# Patient Record
Sex: Male | Born: 1995 | Hispanic: No | Marital: Married | Smoking: Current every day smoker
Health system: Southern US, Community
[De-identification: ages and names within clinical notes are randomized; demographics above are authoritative.]

## PROBLEM LIST (undated history)

## (undated) DIAGNOSIS — R569 Unspecified convulsions: Secondary | ICD-10-CM

---
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
## 2015-03-10 ENCOUNTER — Encounter (HOSPITAL_COMMUNITY): Payer: Self-pay

## 2015-03-10 ENCOUNTER — Emergency Department (HOSPITAL_COMMUNITY)
Admission: EM | Admit: 2015-03-10 | Discharge: 2015-03-10 | Disposition: A | Discharge status: Home / Self Care | Payer: Self-pay | Attending: Emergency Medicine | Admitting: Emergency Medicine

## 2015-03-10 DIAGNOSIS — R070 Pain in throat: Secondary | ICD-10-CM | POA: Insufficient documentation

## 2015-03-10 DIAGNOSIS — Z720 Tobacco use: Secondary | ICD-10-CM | POA: Insufficient documentation

## 2015-03-10 DIAGNOSIS — R0989 Other specified symptoms and signs involving the circulatory and respiratory systems: Secondary | ICD-10-CM | POA: Insufficient documentation

## 2015-03-10 MED ORDER — GI COCKTAIL ~~LOC~~
30.0000 mL | Freq: Once | ORAL | Status: AC
  Administered 2015-03-10: 30 mL via ORAL
  Filled 2015-03-10: qty 30

## 2015-03-10 NOTE — Discharge Instructions (Signed)
Globus Syndrome  Globus Syndrome is a feeling of a lump or a sensation of something caught in your throat. Eating food or drinking fluids does not seem to get rid of it. Yet it is not noticeable during the actual act of swallowing food or liquids. Usually there is nothing physically wrong. It is troublesome because it is an unpleasant sensation which is sometimes difficult to ignore and at times may seem to worsen. The syndrome is quite common. It is estimated 45% of the population experiences features of the condition at some stage during their lives. The symptoms are usually temporary. The largest group of people who feel the need to seek medical treatment is females between the ages of 30 to 60.   CAUSES   Globus Syndrome appears to be triggered by or aggravated by stress, anxiety and depression.  · Tension related to stress could product abnormal muscle spasms in the esophagus which would account for the sensation of a lump or ball in your throat.  · Frequent swallowing or drying of the throat caused by anxiety or other strong emotions can also produce this uncomfortable sensation in your throat.  · Fear and sadness can be expressed by the body in many ways. For instance, if you had a relative with throat cancer you might become overly concerned about your own health and develop uncomfortable sensations in your throat.  · The reaction to a crisis or a trauma event in your life can take the form of a lump in your throat. It is as if you are indirectly saying you can not handle or "swallow" one more thing.  DIAGNOSIS   Usually your caregiver will know what is wrong by talking to you and examining you.  If the condition persists for several days, more testing may be done to make sure there is not another problem present. This is usually not the case.  TREATMENT   · Reassurance is often the best treatment available. Usually the problem leaves without treatment over several days.  · Sometimes anti-anxiety medications  may be prescribed.  · Counseling or talk therapy can also help with strong underlying emotions.  · Note that in most cases this is not something that keeps coming back and you should not be concerned or worried.  Document Released: 12/05/2003 Document Revised: 12/07/2011 Document Reviewed: 05/03/2008  Exit[employer]® Patient Information ©2015 [employer]. This information is not intended to replace advice given to you by your health care provider. Make sure you discuss any questions you have with your health care provider.

## 2015-03-10 NOTE — ED Notes (Signed)
Pt states the GI cocktail helped "a little bit", pt states the pain has decreased, pt states it feels like the fishbone has moved a little bit but still feels like it's in his throat. Pt in no distress.

## 2015-03-10 NOTE — ED Provider Notes (Signed)
CSN: [id number]     Arrival date & time 03/10/15  2021 History   First MD Initiated Contact with Patient 03/10/15 2033     Chief Complaint  Patient presents with  . Foreign Body     (Consider location/radiation/quality/duration/timing/severity/associated sxs/prior Treatment) HPI Comments: Patient presents to the emergency department with chief complaint of fish bone stuck in throat. Patient states that he was eating fish tonight, and swallowed a bone. He states that feels like the bone is stuck in his throat. He states that this is aggravating his throat. States that he has pain when he swallows. States that he tried to vomit, but did not have any relief. He denies any difficulty swallowing, eating, or drinking.  The history is provided by the patient. No language interpreter was used.    History reviewed. No pertinent past medical history. History reviewed. No pertinent past surgical history. History reviewed. No pertinent family history. History  Substance Use Topics  . Smoking status: Current Every Day Smoker  . Smokeless tobacco: Not on file  . Alcohol Use: Yes    Review of Systems  Constitutional: Negative for fever and chills.  HENT:       Foreign body sensation in throat  Respiratory: Negative for shortness of breath.   Cardiovascular: Negative for chest pain.  Gastrointestinal: Negative for nausea, vomiting, diarrhea and constipation.  Genitourinary: Negative for dysuria.  All other systems reviewed and are negative.     Allergies  Review of patient's allergies indicates no known allergies.  Home Medications   Prior to Admission medications   Not on File   BP 153/85 mmHg  Pulse 73  Temp(Src) 98.1 F (36.7 C) (Oral)  Resp 20  Ht  (1.676 m)  Wt 130 lb (58.968 kg)  BMI 20.99 kg/m2  SpO2 100% Physical Exam  Constitutional: He is oriented to person, place, and time. He appears well-developed and well-nourished.  HENT:  Head: Normocephalic and  atraumatic.  Oropharynx is clear, no evidence of foreign body, no abscess, uvula is midline, no stridor, airway is intact  Eyes: Conjunctivae and EOM are normal. Pupils are equal, round, and reactive to light. Right eye exhibits no discharge. Left eye exhibits no discharge. No scleral icterus.  Neck: Normal range of motion. Neck supple. No JVD present.  Cardiovascular: Normal rate, regular rhythm and normal heart sounds.  Exam reveals no gallop and no friction rub.   No murmur heard. Pulmonary/Chest: Effort normal and breath sounds normal. No respiratory distress. He has no wheezes. He has no rales. He exhibits no tenderness.  Abdominal: Soft. He exhibits no distension and no mass. There is no tenderness. There is no rebound and no guarding.  Musculoskeletal: Normal range of motion. He exhibits no edema or tenderness.  Neurological: He is alert and oriented to person, place, and time.  Skin: Skin is warm and dry.  Psychiatric: He has a normal mood and affect. His behavior is normal. Judgment and thought content normal.  Nursing note and vitals reviewed.   ED Course  Procedures (including critical care time) Labs Review Labs Reviewed - No data to display  Imaging Review No results found.   EKG Interpretation None      MDM   Final diagnoses:  Foreign body sensation in throat    Patient with foreign body sensation in throat. Some relief with GI cocktail. Will discharge to home with ENT follow-up. Patient understands agrees the plan. Patient discussed with Dr. Jodi Mourning, who agrees with plan.  Roxy Horseman, PA-C 03/10/15 2200  Blane Ohara, MD 03/19/15 2212

## 2015-03-10 NOTE — ED Notes (Signed)
Pt has a fishbone stuck in his throat, he feels like his throat is swelling

## 2019-12-24 ENCOUNTER — Other Ambulatory Visit: Payer: Self-pay

## 2019-12-24 ENCOUNTER — Emergency Department (HOSPITAL_COMMUNITY)
Admission: EM | Admit: 2019-12-24 | Discharge: 2019-12-24 | Disposition: A | Discharge status: Home / Self Care | Payer: Self-pay | Attending: Emergency Medicine | Admitting: Emergency Medicine

## 2019-12-24 ENCOUNTER — Encounter (HOSPITAL_COMMUNITY): Payer: Self-pay

## 2019-12-24 ENCOUNTER — Emergency Department (HOSPITAL_COMMUNITY): Payer: Self-pay

## 2019-12-24 DIAGNOSIS — F17210 Nicotine dependence, cigarettes, uncomplicated: Secondary | ICD-10-CM | POA: Insufficient documentation

## 2019-12-24 DIAGNOSIS — R569 Unspecified convulsions: Secondary | ICD-10-CM | POA: Insufficient documentation

## 2019-12-24 HISTORY — DX: Unspecified convulsions: R56.9

## 2019-12-24 LAB — RAPID URINE DRUG SCREEN, HOSP PERFORMED
Amphetamines: POSITIVE — AB
Barbiturates: NOT DETECTED
Benzodiazepines: NOT DETECTED
Cocaine: NOT DETECTED
Opiates: NOT DETECTED
Tetrahydrocannabinol: POSITIVE — AB

## 2019-12-24 LAB — CBC WITH DIFFERENTIAL/PLATELET
Abs Immature Granulocytes: 0.27 10*3/uL — ABNORMAL HIGH (ref 0.00–0.07)
Basophils Absolute: 0 10*3/uL (ref 0.0–0.1)
Basophils Relative: 1 %
Eosinophils Absolute: 0.1 10*3/uL (ref 0.0–0.5)
Eosinophils Relative: 1 %
HCT: 47 % (ref 39.0–52.0)
Hemoglobin: 15.5 g/dL (ref 13.0–17.0)
Immature Granulocytes: 4 %
Lymphocytes Relative: 29 %
Lymphs Abs: 1.8 10*3/uL (ref 0.7–4.0)
MCH: 30.6 pg (ref 26.0–34.0)
MCHC: 33 g/dL (ref 30.0–36.0)
MCV: 92.7 fL (ref 80.0–100.0)
Monocytes Absolute: 0.7 10*3/uL (ref 0.1–1.0)
Monocytes Relative: 11 %
Neutro Abs: 3.4 10*3/uL (ref 1.7–7.7)
Neutrophils Relative %: 54 %
Platelets: 105 10*3/uL — ABNORMAL LOW (ref 150–400)
RBC: 5.07 MIL/uL (ref 4.22–5.81)
RDW: 12.9 % (ref 11.5–15.5)
WBC: 6.3 10*3/uL (ref 4.0–10.5)
nRBC: 0 % (ref 0.0–0.2)

## 2019-12-24 LAB — URINALYSIS, ROUTINE W REFLEX MICROSCOPIC
Bilirubin Urine: NEGATIVE
Glucose, UA: NEGATIVE mg/dL
Ketones, ur: 20 mg/dL — AB
Leukocytes,Ua: NEGATIVE
Nitrite: NEGATIVE
Protein, ur: 30 mg/dL — AB
Specific Gravity, Urine: 1.019 (ref 1.005–1.030)
pH: 5 (ref 5.0–8.0)

## 2019-12-24 LAB — COMPREHENSIVE METABOLIC PANEL
ALT: 316 U/L — ABNORMAL HIGH (ref 0–44)
AST: 144 U/L — ABNORMAL HIGH (ref 15–41)
Albumin: 4.1 g/dL (ref 3.5–5.0)
Alkaline Phosphatase: 56 U/L (ref 38–126)
Anion gap: 14 (ref 5–15)
BUN: 9 mg/dL (ref 6–20)
CO2: 20 mmol/L — ABNORMAL LOW (ref 22–32)
Calcium: 9.1 mg/dL (ref 8.9–10.3)
Chloride: 107 mmol/L (ref 98–111)
Creatinine, Ser: 1.16 mg/dL (ref 0.61–1.24)
GFR calc Af Amer: >60 mL/min (ref >60)
GFR calc non Af Amer: >60 mL/min (ref >60)
Glucose, Bld: 97 mg/dL (ref 70–99)
Potassium: 3.3 mmol/L — ABNORMAL LOW (ref 3.5–5.1)
Sodium: 141 mmol/L (ref 135–145)
Total Bilirubin: 1.4 mg/dL — ABNORMAL HIGH (ref 0.3–1.2)
Total Protein: 7.2 g/dL (ref 6.5–8.1)

## 2019-12-24 LAB — MAGNESIUM: Magnesium: 2.3 mg/dL (ref 1.7–2.4)

## 2019-12-24 LAB — ETHANOL: Alcohol, Ethyl (B): <10 mg/dL (ref <10)

## 2019-12-24 LAB — CBG MONITORING, ED: Glucose-Capillary: 83 mg/dL (ref 70–99)

## 2019-12-24 MED ORDER — LORAZEPAM 2 MG/ML IJ SOLN
INTRAMUSCULAR | Status: AC
  Administered 2019-12-24: 1 mg
  Filled 2019-12-24: qty 1

## 2019-12-24 NOTE — ED Notes (Signed)
Pt's mother updated with pt's permission.  °

## 2019-12-24 NOTE — ED Notes (Signed)
Pt states that he took insulin last night; when asked how much, pt stated "to the 40 on the little syringe"; no hx DM, states he "found it and wanted to try it"; [place], PA, notfied

## 2019-12-24 NOTE — ED Notes (Signed)
Wasted 1 mg atvian in [facility] room; Witnessed by [place], Charity fundraiser

## 2019-12-24 NOTE — Discharge Instructions (Addendum)
As we discussed, your work-up today was reassuring.  We think that the seizure was most likely caused by a combo of drugs and insulin.  Do not take any more insulin.  As we discussed, you will need to follow-up with neurology.  I provided you a referral.  Please call their office.  You should not drive, bathe, shower, swim alone until you are seen by neurology.  Return the emergency department for any worsening seizure, vomiting, any other worsening or concerning symptoms.

## 2019-12-24 NOTE — ED Notes (Signed)
Pt ambulated in room without assistance. No reports of dizziness or lightheadedness.
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
## 2019-12-24 NOTE — ED Notes (Signed)
Pt transported to CT ?
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
## 2019-12-24 NOTE — ED Notes (Signed)
Patient verbalizes understanding of discharge instructions. Opportunity for questioning and answers were provided. Armband removed by staff, pt discharged from ED. Pt. ambulatory and discharged home.  

## 2019-12-24 NOTE — ED Notes (Signed)
Pt transported to xray 

## 2020-02-09 ENCOUNTER — Ambulatory Visit: Payer: Self-pay | Admitting: Neurology

## 2020-02-09 ENCOUNTER — Telehealth: Payer: Self-pay

## 2020-02-09 NOTE — Telephone Encounter (Signed)
Pt no showed 02/09/2020 new pt appt with Dr. Terrace Arabia.

## 2020-07-28 IMAGING — CT CT HEAD W/O CM
3 series · 16 of 47 positions shown, 19 images · non-contrast
Comparison: None.

CLINICAL DATA: Patient with seizure.

EXAM:
CT HEAD WITHOUT CONTRAST
TECHNIQUE: Contiguous axial images were obtained from the base of the skull
through the vertex without intravenous contrast.

[Series 3: head 5.0 h30s · axial · 0.42mm/px · z∈[-124,+6]mm · 10 of 32 slices shown, 13 images]
[im 3/32  brain]
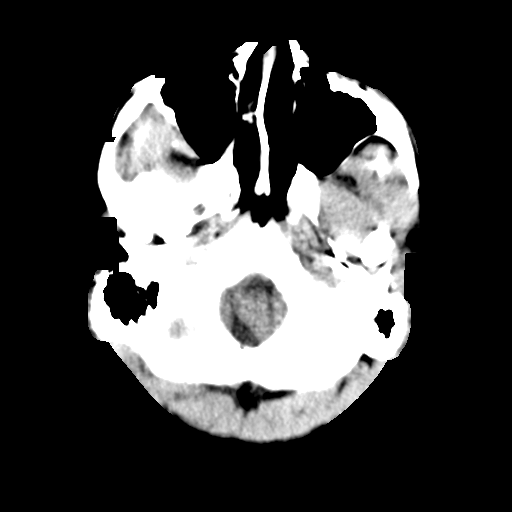
[im 3/32  bone]
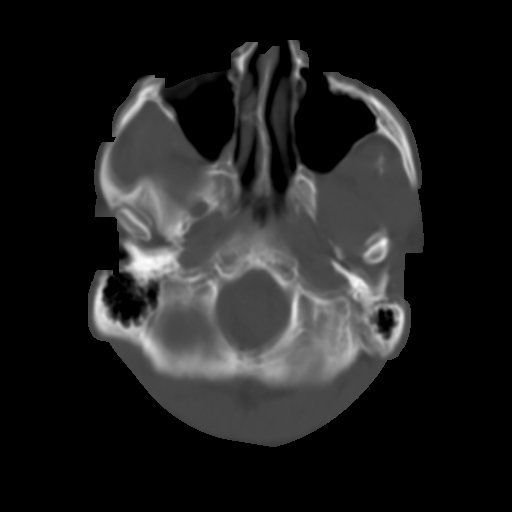
[im 6/32  brain]
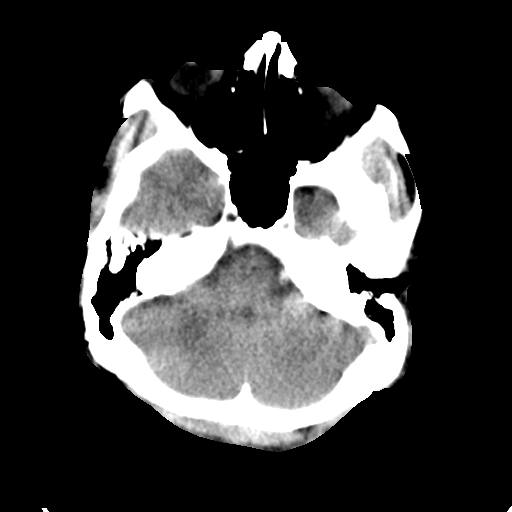
[im 9/32  brain]
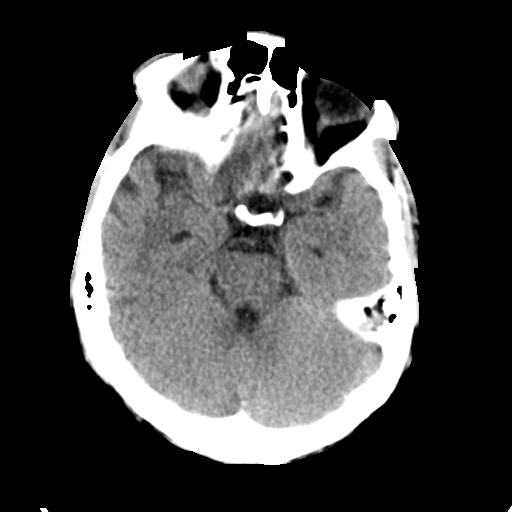
[im 11/32  brain]
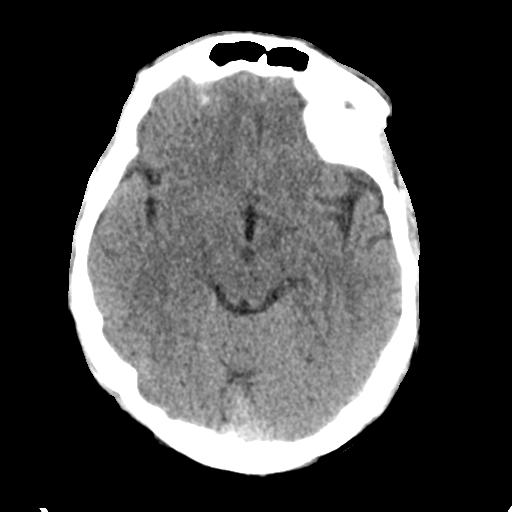
[im 14/32  brain]
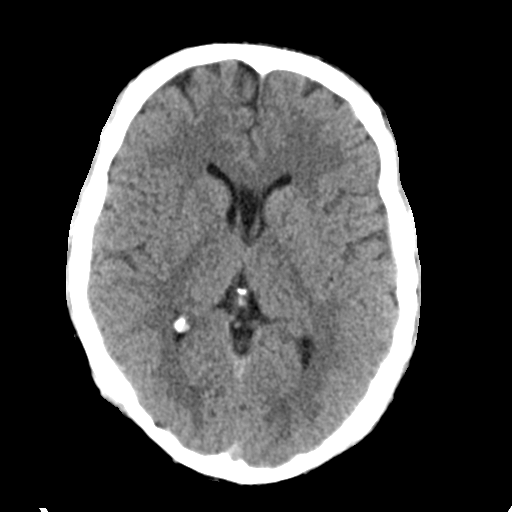
[im 14/32  bone]
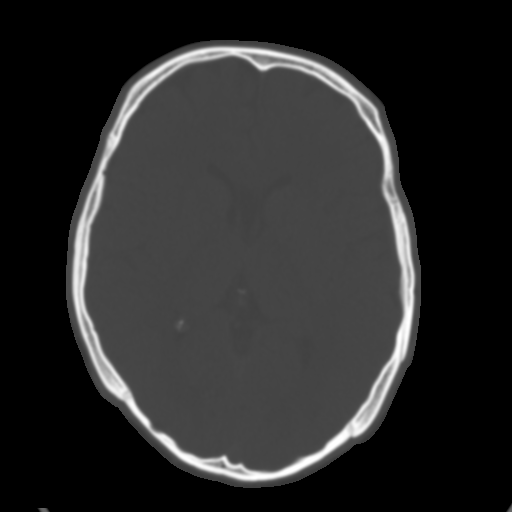
[im 18/32  brain]
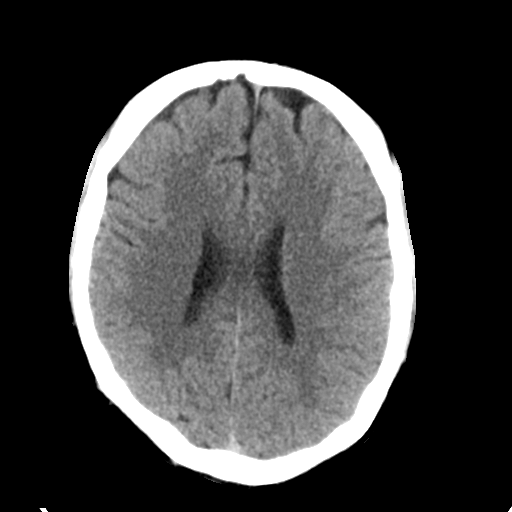
[im 21/32  brain]
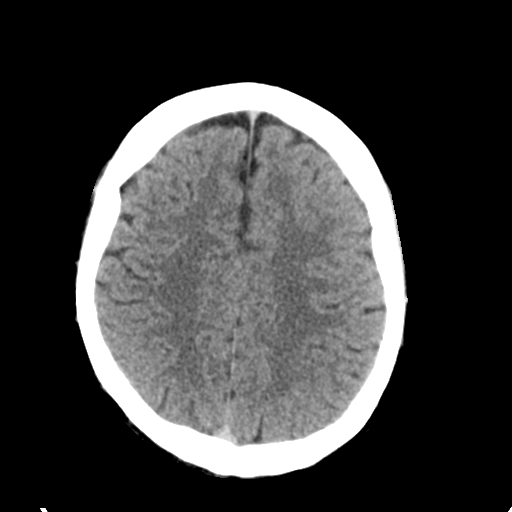
[im 24/32  brain]
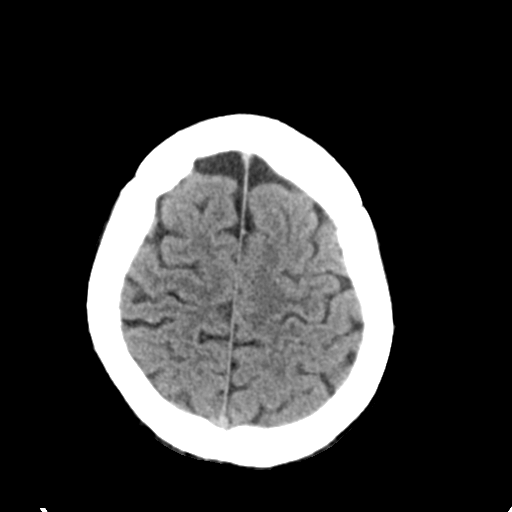
[im 26/32  brain]
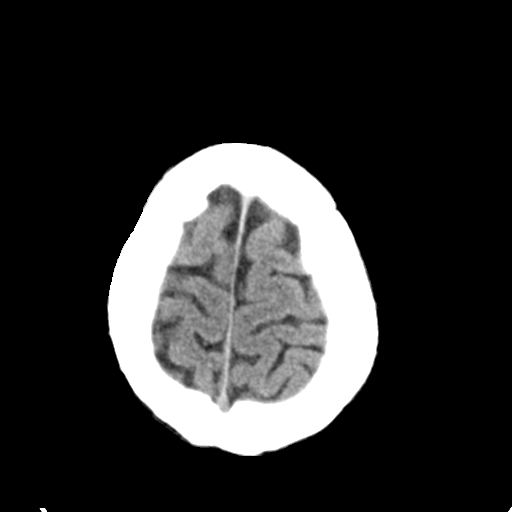
[im 26/32  bone]
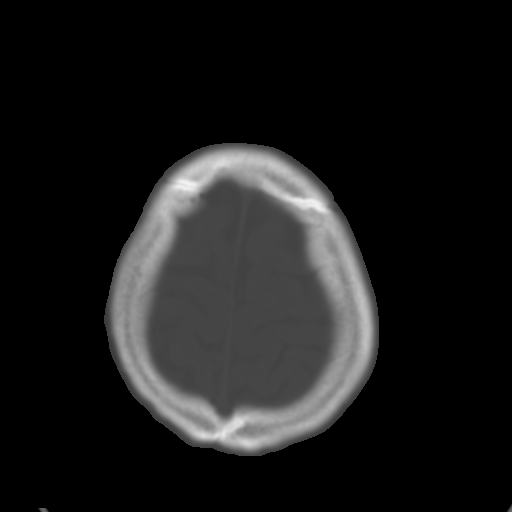
[im 29/32  brain]
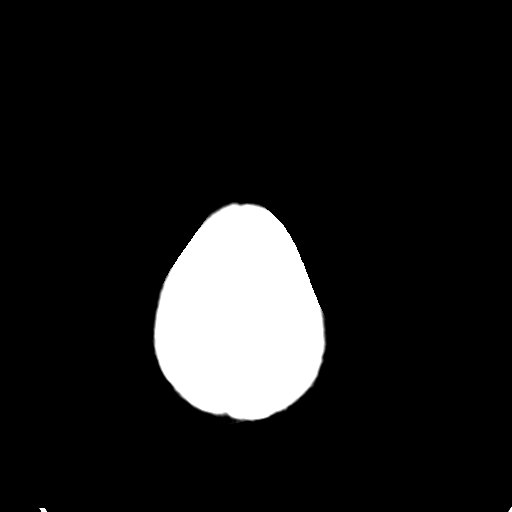

[Series 5: head 3.0 mpr cor · coronal · 0.32mm/px · 3 of 67 slices shown]
[im 23/67  brain]
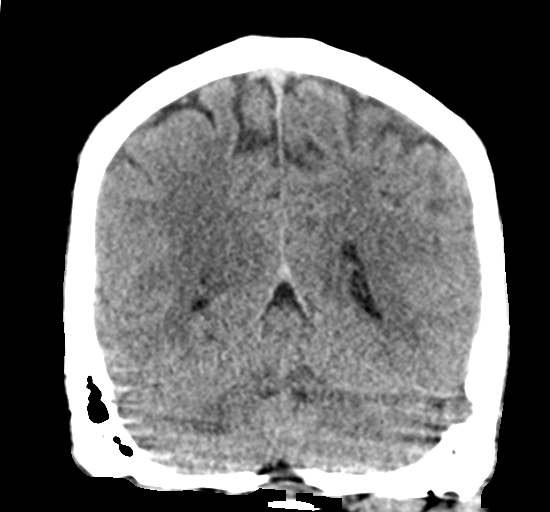
[im 30/67  brain]
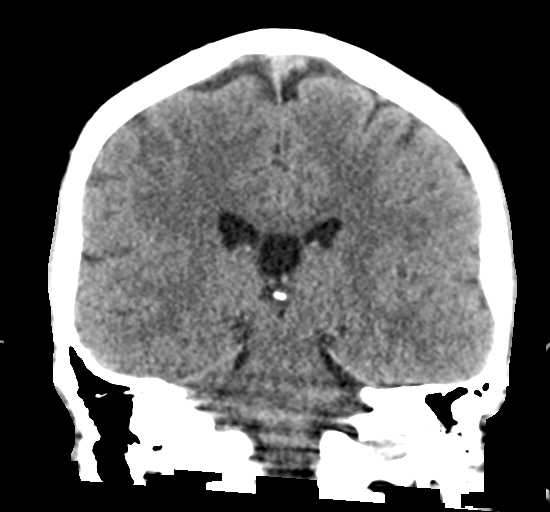
[im 37/67  brain]
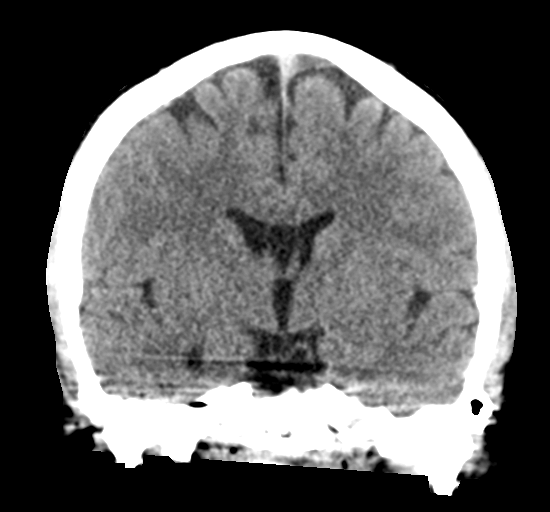

[Series 6: head 3.0 mpr sag · sagittal · 0.32mm/px · 3 of 57 slices shown]
[im 19/57  brain]
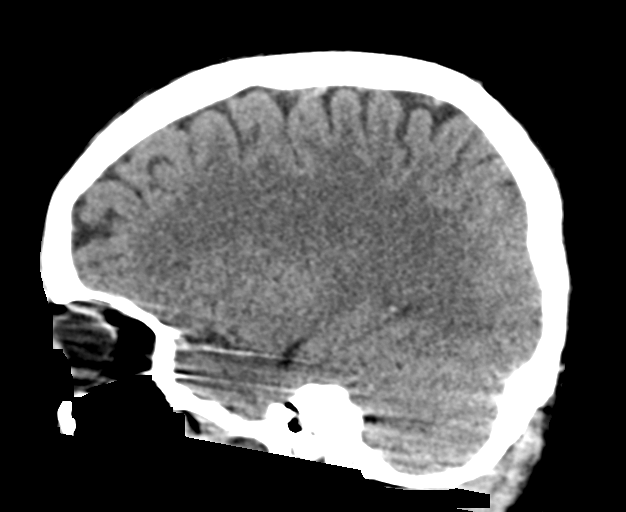
[im 29/57  brain]
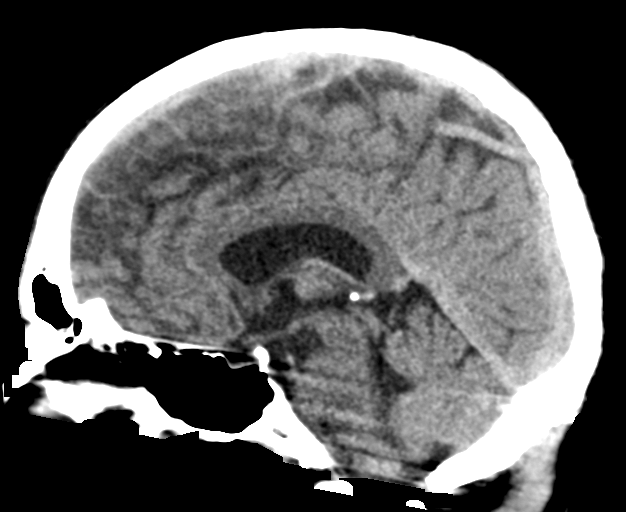
[im 38/57  brain]
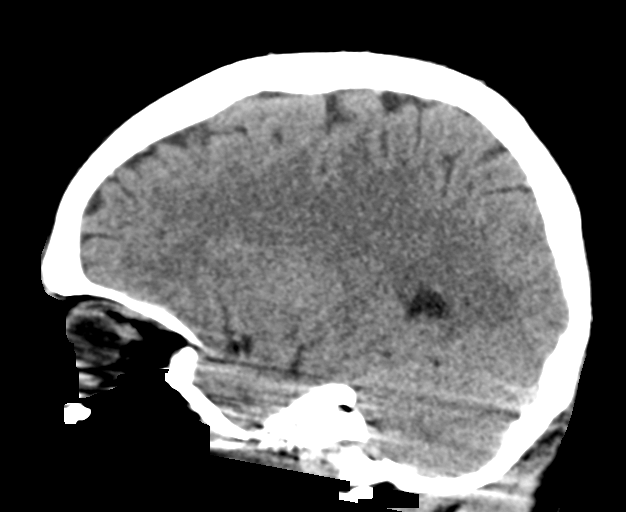

[16 of 47 positions shown; findings below may reference images not displayed]

FINDINGS: Brain: No evidence of acute infarction, hemorrhage, hydrocephalus,
extra-axial collection or mass lesion/mass effect.

Vascular: No hyperdense vessel or unexpected calcification.

Skull: Normal. Negative for fracture or focal lesion.

Sinuses/Orbits: No acute finding.

Other: None.
IMPRESSION: No acute intracranial process.

## 2020-08-26 ENCOUNTER — Ambulatory Visit (HOSPITAL_COMMUNITY): Payer: Self-pay | Admitting: Behavioral Health

## 2020-11-14 ENCOUNTER — Encounter (HOSPITAL_COMMUNITY): Payer: Self-pay | Admitting: Emergency Medicine

## 2020-11-14 ENCOUNTER — Emergency Department (HOSPITAL_COMMUNITY)
Admission: EM | Admit: 2020-11-14 | Discharge: 2020-11-15 | Disposition: A | Discharge status: Home / Self Care | Payer: Self-pay | Attending: Emergency Medicine | Admitting: Emergency Medicine

## 2020-11-14 DIAGNOSIS — S60511A Abrasion of right hand, initial encounter: Secondary | ICD-10-CM | POA: Insufficient documentation

## 2020-11-14 DIAGNOSIS — W01198A Fall on same level from slipping, tripping and stumbling with subsequent striking against other object, initial encounter: Secondary | ICD-10-CM | POA: Insufficient documentation

## 2020-11-14 DIAGNOSIS — F17200 Nicotine dependence, unspecified, uncomplicated: Secondary | ICD-10-CM | POA: Insufficient documentation

## 2020-11-14 DIAGNOSIS — F1290 Cannabis use, unspecified, uncomplicated: Secondary | ICD-10-CM | POA: Insufficient documentation

## 2020-11-14 DIAGNOSIS — Z23 Encounter for immunization: Secondary | ICD-10-CM | POA: Insufficient documentation

## 2020-11-14 DIAGNOSIS — M542 Cervicalgia: Secondary | ICD-10-CM | POA: Insufficient documentation

## 2020-11-14 DIAGNOSIS — Y92410 Unspecified street and highway as the place of occurrence of the external cause: Secondary | ICD-10-CM | POA: Insufficient documentation

## 2020-11-14 DIAGNOSIS — S01511A Laceration without foreign body of lip, initial encounter: Secondary | ICD-10-CM | POA: Insufficient documentation

## 2020-11-14 MED ORDER — KETOROLAC TROMETHAMINE 60 MG/2ML IM SOLN
60.0000 mg | Freq: Once | INTRAMUSCULAR | Status: AC
  Administered 2020-11-14: 60 mg via INTRAMUSCULAR
  Filled 2020-11-14: qty 2

## 2020-11-14 MED ORDER — TETANUS-DIPHTH-ACELL PERTUSSIS 5-2.5-18.5 LF-MCG/0.5 IM SUSY
0.5000 mL | PREFILLED_SYRINGE | Freq: Once | INTRAMUSCULAR | Status: AC
  Administered 2020-11-14: 0.5 mL via INTRAMUSCULAR
  Filled 2020-11-14: qty 0.5

## 2020-11-14 MED ORDER — LIDOCAINE-EPINEPHRINE (PF) 2 %-1:200000 IJ SOLN
10.0000 mL | Freq: Once | INTRAMUSCULAR | Status: AC
  Administered 2020-11-14: 10 mL
  Filled 2020-11-14: qty 20
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
## 2020-11-15 MED ORDER — AMOXICILLIN-POT CLAVULANATE 875-125 MG PO TABS
1.0000 | ORAL_TABLET | Freq: Once | ORAL | Status: AC
  Administered 2020-11-15: 1 via ORAL
  Filled 2020-11-15: qty 1

## 2020-11-15 MED ORDER — AMOXICILLIN-POT CLAVULANATE 875-125 MG PO TABS: 1.0000 | ORAL_TABLET | Freq: Two times a day (BID) | ORAL | 0 refills | Status: DC

## 2020-11-15 NOTE — ED Notes (Signed)
Pt refused discharge vital signs

## 2020-11-15 NOTE — Discharge Instructions (Addendum)
Please read the attachment on mouth lacerations.  You have been repaired with sutures that will dissolve spontaneously in the next 5 to 10 days.  Do not remove them prematurely.  Do not pick at them.  You have been given first dose of Augmentin here in the ED.  Please pick up your prescription for a 7-day course.  This will help mitigate risk of infection of your mouth and/or hand.  Please take ibuprofen 600 mg every 6 hours as needed for pain and inflammation.  DO NOT SMOKE anything for the next 14 days.    Please follow-up with your primary care provider regarding today's encounter and for ongoing evaluation.  You will need to have a wound check in the next 3 days.  Return to the ED or seek immediate medical attention should you express any new or worsening symptoms.

## 2020-11-18 ENCOUNTER — Other Ambulatory Visit: Payer: Self-pay

## 2020-11-18 ENCOUNTER — Ambulatory Visit (INDEPENDENT_AMBULATORY_CARE_PROVIDER_SITE_OTHER): Payer: No Payment, Other | Admitting: Behavioral Health

## 2020-11-18 DIAGNOSIS — F1910 Other psychoactive substance abuse, uncomplicated: Secondary | ICD-10-CM

## 2020-11-18 NOTE — Progress Notes (Signed)
Comprehensive Clinical Assessment (CCA) Note  11/18/2020 Leola BrazilMiguel Devins [id number]  Leola BrazilMiguel Barga is a 25 year old male who presents to [facility] for a scheduled Comprehensive Clinical Assessment. Clt reports he is being referred by his probation officer and the [facility] for mental health and substance use treatment. Client has has an upcoming court date on 11/28/2020 for the following current pending legal charges:    Felony-POSSESS STOLEN MOTOR VEHICLE    FELONY POSSESSION METHAMPHETAMINE    Felony-FELONY PROBATION VIOLATION   Felony-FELONY PROBATION VIOLATION  Felony-SECOND DEGREE BURGLARY  Felony-LARCENY AFTER BREAK/ENTER  Felony-FELONY PROBATION VIOLATION  Felony-FELONY PROBATION VIOLATION  Clt reports he last used marijuana "5-6 days ago" and heroin and meth "6-8 months ago". Clt did not appear to be forthcoming with reporting hx of substance use. Clt further reports sxs of depression such as hopelessness, worthlessness, agitation, and poor sleep patterns. Clt denied past psychiatric hospitalization. He reports he [facility], but reports this was unsuccessful. He denied SI/HI. Pt reported hx of auditory hallucinations; however, he was unable to describe these audible sounds. Clt did not appear to be responding to internal stimuli while speaking with this Clinical research associatewriter. Collateral information was obtained from clt's probation officer Adella Nissen(Leslie Huber: [phone] / [phone]) - clt signed ROI.  Recommendation: Substance Abuse Intensive Outpatient   Chief Complaint:  Chief Complaint  Patient presents with  . Addiction Problem   Visit Diagnosis:  Polysubstance Use Disorder Antisocial Personality Disorder    CCA Screening, Triage and Referral (STR)  Patient Reported Information How did you hear about us? Legal System  Referral name: Adella NissenLeslie Huber (probation officer)  Referral phone number:  [phone]   Whom do you see for routine medical problems? I don't have a doctor  Practice/Facility Name: No data recorded Practice/Facility Phone Number: No data recorded Name of Contact: No data recorded Contact Number: No data recorded Contact Fax Number: No data recorded Prescriber Name: No data recorded Prescriber Address (if known): No data recorded  What Is the Reason for Your Visit/Call Today? Mental Health Court  How Long Has This Been Causing You Problems? > than 6 months  What Do You Feel Would Help You the Most Today? Group Therapy   Have You Recently Been in Any Inpatient Treatment (Hospital/Detox/Crisis Center/28-Day Program)? No  Name/Location of Program/Hospital:No data recorded How Long Were You There? No data recorded When Were You Discharged? No data recorded  Have You Ever Received Services [facility] Before? No  Who [facility] You [facility]? No data recorded  Have You Recently Had Any Thoughts About Hurting Yourself? No  Are You Planning to Commit Suicide/Harm Yourself At This time? No   Have you Recently Had Thoughts About Hurting Someone Karolee Ohslse? No  Explanation: No data recorded  Have You Used Any Alcohol or Drugs in the Past 24 Hours? No  How Long Ago Did You Use Drugs or Alcohol? No data recorded What Did You Use and How Much? No data recorded  Do You Currently Have a Therapist/Psychiatrist? No  Name of Therapist/Psychiatrist: No data recorded  Have You Been Recently Discharged From Any Office Practice or Programs? No  Explanation of Discharge From Practice/Program: No data recorded    CCA Screening Triage Referral Assessment Type of Contact: No data recorded Is this Initial or Reassessment? No data recorded Date Telepsych consult ordered in [facility]:  No data recorded Time Telepsych consult ordered in [facility]:  No data recorded  Patient Reported Information Reviewed? No data recorded Patient Left Without Being Seen? No data recorded Reason  for Not Completing Assessment: No data recorded  Collateral Involvement: No data recorded  Does Patient Have a Court Appointed Legal Guardian? No data recorded Name and Contact of Legal Guardian: No data recorded If Minor and Not Living with Parent(s), Who has Custody? No data recorded Is CPS involved or ever been involved? No data recorded Is APS involved or ever been involved? No data recorded  Patient Determined To Be At Risk for Harm To Self or Others Based on Review of Patient Reported Information or Presenting Complaint? No data recorded Method: No data recorded Availability of Means: No data recorded Intent: No data recorded Notification Required: No data recorded Additional Information for Danger to Others Potential: No data recorded Additional Comments for Danger to Others Potential: No data recorded Are There Guns or Other Weapons in Your Home? No data recorded Types of Guns/Weapons: No data recorded Are These Weapons Safely Secured?                            No data recorded Who Could Verify You Are Able To Have These Secured: No data recorded Do You Have any Outstanding Charges, Pending Court Dates, Parole/Probation? No data recorded Contacted To Inform of Risk of Harm To Self or Others: No data recorded  Location of Assessment: No data recorded  Does Patient Present under Involuntary Commitment? No data recorded IVC Papers Initial File Date: No data recorded  [place] of Residence: No data recorded  Patient Currently Receiving the Following Services: No data recorded  Determination of Need: No data recorded  Options For Referral: No data recorded    CCA Biopsychosocial Intake/Chief Complaint:  Active addiction; depression  Current Symptoms/Problems: Depression   Patient Reported Schizophrenia/Schizoaffective Diagnosis in Past: No   Strengths: No data recorded Preferences: No data recorded Abilities: No data recorded  Type of Services Patient Feels are  Needed: No data recorded  Initial Clinical Notes/Concerns: No data recorded  Mental Health Symptoms Depression:  Difficulty Concentrating; Hopelessness; Worthlessness; Tearfulness; Irritability; Change in energy/activity; Increase/decrease in appetite; Sleep (too much or little)   Duration of Depressive symptoms: Greater than two weeks ("since I was 25yo")   Mania:  None   Anxiety:   None   Psychosis:  Hallucinations   Duration of Psychotic symptoms: Greater than six months   Trauma:  Hypervigilance; Avoids reminders of event; Irritability/anger; Guilt/shame; Difficulty staying/falling asleep; Re-experience of traumatic event; Emotional numbing   Obsessions:  N/A   Compulsions:  N/A   Inattention:  N/A   Hyperactivity/Impulsivity:  N/A   Oppositional/Defiant Behaviors:  N/A   Emotional Irregularity:  N/A   Other Mood/Personality Symptoms:  No data recorded   Mental Status Exam Appearance and self-care  Stature:  Small   Weight:  Average weight   Clothing:  Age-appropriate   Grooming:  Normal   Cosmetic use:  None   Posture/gait:  Normal   Motor activity:  Not Remarkable   Sensorium  Attention:  Normal   Concentration:  Normal   Orientation:  X5   Recall/memory:  Normal   Affect and Mood  Affect:  Appropriate   Mood:  Other (Comment) (Pleasant)   Relating  Eye contact:  Fleeting   Facial expression:  Responsive   Attitude toward examiner:  Cooperative   Thought and Language  Speech flow: Clear and Coherent   Thought content:  Appropriate to Mood and Circumstances   Preoccupation:  None   Hallucinations:  None   Organization:  No data recorded  [employer] of Knowledge:  Fair   Intelligence:  Average   Abstraction:  Normal   Judgement:  Poor   Reality Testing:  Adequate   Insight:  Poor   Decision Making:  Impulsive   Social Functioning  Social Maturity:  Impulsive; Irresponsible   Social Judgement:   Heedless; Victimized; "Garment/textile technologist   Stress  Stressors:  Family conflict   Coping Ability:  Deficient supports   Skill Deficits:  Merchant navy officer   Supports:  Family     Religion: Religion/Spirituality Are You A Religious Person?: Yes What is Your Religious Affiliation?: Catholic How Might This Affect Treatment?: None  Leisure/Recreation: Leisure / Recreation Do You Have Hobbies?: Yes Leisure and Hobbies: "Soccer, fishing"  Exercise/Diet: Exercise/Diet Do You Exercise?: No Have You Gained or Lost A Significant Amount of Weight in the Past Six Months?: No Do You Follow a Special Diet?: No Do You Have Any Trouble Sleeping?: Yes Explanation of Sleeping Difficulties: Difficulties getting to sleep and staying asleep   CCA Employment/Education Employment/Work Situation: Employment / Work Situation Employment situation: Unemployed What is the longest time patient has a held a job?: 2-3 weeks Where was the patient employed at that time?: [employer] Has patient ever been in the [employer]?: No  Education: Education Is Patient Currently Attending School?: No Last Grade Completed: 11 Name of High School: [employer] Did Garment/textile technologist From [employer]?: No Did You Product manager?: No Did Designer, television/film set?: No Did You Have An Individualized Education Program (IIEP): Yes (Speech classes) Did You Have Any Difficulty At School?: No Patient's Education Has Been Impacted by Current Illness: No   CCA Family/Childhood History Family and Relationship History: Family history Marital status: Single Are you sexually active?: Yes What is your sexual orientation?: Heterosexual Does patient have children?: No  Childhood History:  Childhood History By whom was/is the patient raised?: Mother Description of patient's relationship with caregiver when they were a child: "Pretty good" Patient's description of current relationship with people  who raised him/her: "Pretty good" How were you disciplined when you got in trouble as a child/adolescent?: UTA Does patient have siblings?: Yes Number of Siblings: 2 Description of patient's current relationship with siblings: 2 Sisters (19yo & 26yo) - "not so good .Marland Kitchen yesterday my sister was trying to call the police on me" Did patient suffer any verbal/emotional/physical/sexual abuse as a child?: No Did patient suffer from severe childhood neglect?: No Has patient ever been sexually abused/assaulted/raped as an adolescent or adult?: No Was the patient ever a victim of a crime or a disaster?: No Witnessed domestic violence?: No Has patient been affected by domestic violence as an adult?: No  Child/Adolescent Assessment: N/A     CCA Substance Use Alcohol/Drug Use: Alcohol / Drug Use Pain Medications: See MAR Prescriptions: See MAR Over the Counter: See MAR History of alcohol / drug use?: Yes Longest period of sobriety (when/how long): Pt unable to recall Negative Consequences of Use: Legal,Personal relationships,Work / School,Financial Withdrawal Symptoms: Aggressive/Assaultive,Irritability Substance #1 Name of Substance 1: Heroin 1 - Age of First Use: 17 1 - Amount (size/oz): $10.00 1 - Frequency: Daily 1 - Duration: UTA 1 - Last Use / Amount: 6-8 months ago 1 - Method of Aquiring: Working 1- Route of Use: IV Substance #2 Name of Substance 2: Meth 2 - Age of First  Use: 17 2 - Amount (size/oz): "a gram a day" 2 - Frequency: Daily 2 - Duration: Years 2 - Last Use / Amount: 6-8 months ago 2 - Method of Aquiring: "Working" 2 - Route of Substance Use: Snort then to IV Substance #3 Name of Substance 3: Marijuana 3 - Age of First Use: 15 3 - Amount (size/oz): $5.00 3 - Frequency: Daily 3 - Duration: Years 3 - Last Use / Amount: 5-6 days ago 3 - Method of Aquiring: Money from mother and sister 3 - Route of Substance Use: Smoking      ASAM's:  Six Dimensions of  Multidimensional Assessment  Dimension 1:  Acute Intoxication and/or Withdrawal Potential:   Dimension 1:  Description of individual's past and current experiences of substance use and withdrawal: Continued substance use despite negative consequences  Dimension 2:  Biomedical Conditions and Complications:   Dimension 2:  Description of patient's biomedical conditions and  complications: Clt reports hx of abdominal pain that he has reported in the past  Dimension 3:  Emotional, Behavioral, or Cognitive Conditions and Complications:  Dimension 3:  Description of emotional, behavioral, or cognitive conditions and complications: Reported depressive sxs  Dimension 4:  Readiness to Change:  Dimension 4:  Description of Readiness to Change criteria: Pre-contemplation  Dimension 5:  Relapse, Continued use, or Continued Problem Potential:  Dimension 5:  Relapse, continued use, or continued problem potential critiera description: High risk for relapse  Dimension 6:  Recovery/Living Environment:  Dimension 6:  Recovery/Iiving environment criteria description: Stressful living environment  ASAM Severity Score: ASAM's Severity Rating Score: 18  ASAM Recommended Level of Treatment: ASAM Recommended Level of Treatment: Level II Intensive Outpatient Treatment   Substance use Disorder (SUD) Substance Use Disorder (SUD)  Checklist Symptoms of Substance Use: Continued use despite having a persistent/recurrent physical/psychological problem caused/exacerbated by use,Continued use despite persistent or recurrent social, interpersonal problems, caused or exacerbated by use,Evidence of tolerance,Large amounts of time spent to obtain, use or recover from the substance(s),Persistent desire or unsuccessful efforts to cut down or control use,Presence of craving or strong urge to use,Recurrent use that results in a failure to fulfill major role obligations (work, school, home),Repeated use in physically hazardous  situations,Social, occupational, recreational activities given up or reduced due to use,Substance(s) often taken in larger amounts or over longer times than was intended  Recommendations for Services/Supports/Treatments: Recommendations for Services/Supports/Treatments Recommendations For Services/Supports/Treatments: SAIOP (Substance Abuse Intensive Outpatient Program)  DSM5 Diagnoses: There are no problems to display for this patient.   Patient Centered Plan: Patient is on the following Treatment Plan(s):  Depression, Impulse Control, Post Traumatic Stress Disorder and Substance Abuse  Mamie Nick, Counselor

## 2020-11-22 ENCOUNTER — Ambulatory Visit (HOSPITAL_COMMUNITY): Payer: No Payment, Other | Admitting: Behavioral Health

## 2020-11-25 ENCOUNTER — Ambulatory Visit (HOSPITAL_COMMUNITY): Payer: No Payment, Other | Admitting: Behavioral Health

## 2020-11-29 ENCOUNTER — Ambulatory Visit (HOSPITAL_COMMUNITY): Payer: No Payment, Other | Admitting: Behavioral Health

## 2020-12-02 ENCOUNTER — Ambulatory Visit (HOSPITAL_COMMUNITY): Payer: No Payment, Other | Admitting: Behavioral Health

## 2020-12-04 ENCOUNTER — Telehealth (HOSPITAL_COMMUNITY): Payer: Self-pay | Admitting: Behavioral Health

## 2020-12-06 ENCOUNTER — Ambulatory Visit (HOSPITAL_COMMUNITY): Payer: No Payment, Other

## 2020-12-09 ENCOUNTER — Ambulatory Visit (HOSPITAL_COMMUNITY): Payer: No Payment, Other

## 2020-12-13 ENCOUNTER — Ambulatory Visit (HOSPITAL_COMMUNITY): Payer: No Payment, Other

## 2020-12-16 ENCOUNTER — Ambulatory Visit (HOSPITAL_COMMUNITY): Payer: No Payment, Other

## 2020-12-20 ENCOUNTER — Ambulatory Visit (HOSPITAL_COMMUNITY): Payer: No Payment, Other

## 2020-12-23 ENCOUNTER — Ambulatory Visit (HOSPITAL_COMMUNITY): Payer: No Payment, Other

## 2020-12-27 ENCOUNTER — Ambulatory Visit (HOSPITAL_COMMUNITY): Payer: No Payment, Other

## 2022-07-30 ENCOUNTER — Encounter (HOSPITAL_COMMUNITY): Payer: Self-pay | Admitting: Emergency Medicine

## 2022-07-30 ENCOUNTER — Ambulatory Visit (HOSPITAL_COMMUNITY)
Admission: EM | Admit: 2022-07-30 | Discharge: 2022-07-31 | Disposition: A | Discharge status: Home / Self Care | Payer: No Payment, Other | Attending: Family | Admitting: Family

## 2022-07-30 DIAGNOSIS — F23 Brief psychotic disorder: Secondary | ICD-10-CM

## 2022-07-30 DIAGNOSIS — F1520 Other stimulant dependence, uncomplicated: Secondary | ICD-10-CM | POA: Diagnosis not present

## 2022-07-30 DIAGNOSIS — F29 Unspecified psychosis not due to a substance or known physiological condition: Secondary | ICD-10-CM

## 2022-07-30 DIAGNOSIS — F1290 Cannabis use, unspecified, uncomplicated: Secondary | ICD-10-CM | POA: Diagnosis not present

## 2022-07-30 DIAGNOSIS — Z1152 Encounter for screening for COVID-19: Secondary | ICD-10-CM | POA: Diagnosis not present

## 2022-07-30 DIAGNOSIS — F1190 Opioid use, unspecified, uncomplicated: Secondary | ICD-10-CM | POA: Insufficient documentation

## 2022-07-30 DIAGNOSIS — Z5900 Homelessness unspecified: Secondary | ICD-10-CM | POA: Insufficient documentation

## 2022-07-30 DIAGNOSIS — F1020 Alcohol dependence, uncomplicated: Secondary | ICD-10-CM | POA: Insufficient documentation

## 2022-07-30 LAB — LIPID PANEL
Cholesterol: 93 mg/dL (ref 0–200)
HDL: 31 mg/dL — ABNORMAL LOW (ref >40)
LDL Cholesterol: 51 mg/dL (ref 0–99)
Total CHOL/HDL Ratio: 3 RATIO
Triglycerides: 56 mg/dL (ref <150)
VLDL: 11 mg/dL (ref 0–40)

## 2022-07-30 LAB — CBC WITH DIFFERENTIAL/PLATELET
Abs Immature Granulocytes: 0.02 10*3/uL (ref 0.00–0.07)
Basophils Absolute: 0.1 10*3/uL (ref 0.0–0.1)
Basophils Relative: 1 %
Eosinophils Absolute: 0.1 10*3/uL (ref 0.0–0.5)
Eosinophils Relative: 2 %
HCT: 41.7 % (ref 39.0–52.0)
Hemoglobin: 14.6 g/dL (ref 13.0–17.0)
Immature Granulocytes: 0 %
Lymphocytes Relative: 32 %
Lymphs Abs: 1.7 10*3/uL (ref 0.7–4.0)
MCH: 30.7 pg (ref 26.0–34.0)
MCHC: 35 g/dL (ref 30.0–36.0)
MCV: 87.8 fL (ref 80.0–100.0)
Monocytes Absolute: 0.5 10*3/uL (ref 0.1–1.0)
Monocytes Relative: 9 %
Neutro Abs: 2.8 10*3/uL (ref 1.7–7.7)
Neutrophils Relative %: 56 %
Platelets: 118 10*3/uL — ABNORMAL LOW (ref 150–400)
RBC: 4.75 MIL/uL (ref 4.22–5.81)
RDW: 12.1 % (ref 11.5–15.5)
WBC: 5.2 10*3/uL (ref 4.0–10.5)
nRBC: 0 % (ref 0.0–0.2)

## 2022-07-30 LAB — COMPREHENSIVE METABOLIC PANEL
ALT: 18 U/L (ref 0–44)
AST: 20 U/L (ref 15–41)
Albumin: 3.8 g/dL (ref 3.5–5.0)
Alkaline Phosphatase: 50 U/L (ref 38–126)
Anion gap: 9 (ref 5–15)
BUN: 10 mg/dL (ref 6–20)
CO2: 26 mmol/L (ref 22–32)
Calcium: 9.3 mg/dL (ref 8.9–10.3)
Chloride: 106 mmol/L (ref 98–111)
Creatinine, Ser: 0.85 mg/dL (ref 0.61–1.24)
GFR, Estimated: >60 mL/min (ref >60)
Glucose, Bld: 103 mg/dL — ABNORMAL HIGH (ref 70–99)
Potassium: 3.5 mmol/L (ref 3.5–5.1)
Sodium: 141 mmol/L (ref 135–145)
Total Bilirubin: 0.3 mg/dL (ref 0.3–1.2)
Total Protein: 6.7 g/dL (ref 6.5–8.1)

## 2022-07-30 LAB — HEMOGLOBIN A1C
Hgb A1c MFr Bld: 4.4 % — ABNORMAL LOW (ref 4.8–5.6)
Mean Plasma Glucose: 79.58 mg/dL

## 2022-07-30 LAB — POC SARS CORONAVIRUS 2 AG: SARSCOV2ONAVIRUS 2 AG: NEGATIVE

## 2022-07-30 LAB — ETHANOL: Alcohol, Ethyl (B): <10 mg/dL (ref <10)

## 2022-07-30 LAB — RESP PANEL BY RT-PCR (FLU A&B, COVID) ARPGX2
Influenza A by PCR: NEGATIVE
Influenza B by PCR: NEGATIVE
SARS Coronavirus 2 by RT PCR: NEGATIVE

## 2022-07-30 LAB — TSH: TSH: 0.93 u[IU]/mL (ref 0.350–4.500)

## 2022-07-30 LAB — MAGNESIUM: Magnesium: 1.9 mg/dL (ref 1.7–2.4)

## 2022-07-30 MED ORDER — HYDROXYZINE HCL 25 MG PO TABS: 25.0000 mg | ORAL_TABLET | Freq: Three times a day (TID) | ORAL | Status: DC | PRN

## 2022-07-30 MED ORDER — OLANZAPINE 5 MG PO TBDP
5.0000 mg | ORAL_TABLET | Freq: Two times a day (BID) | ORAL | Status: DC
  Administered 2022-07-30 – 2022-07-31 (×3): 5 mg via ORAL
  Filled 2022-07-30 (×3): qty 1

## 2022-07-30 MED ORDER — ACETAMINOPHEN 325 MG PO TABS: 650.0000 mg | ORAL_TABLET | Freq: Four times a day (QID) | ORAL | Status: DC | PRN

## 2022-07-30 MED ORDER — TRAZODONE HCL 50 MG PO TABS
50.0000 mg | ORAL_TABLET | Freq: Every evening | ORAL | Status: DC | PRN
  Administered 2022-07-30: 50 mg via ORAL
  Filled 2022-07-30: qty 1

## 2022-07-30 MED ORDER — ALUM & MAG HYDROXIDE-SIMETH 200-200-20 MG/5ML PO SUSP: 30.0000 mL | ORAL | Status: DC | PRN

## 2022-07-30 MED ORDER — MAGNESIUM HYDROXIDE 400 MG/5ML PO SUSP: 30.0000 mL | Freq: Every day | ORAL | Status: DC | PRN

## 2022-07-30 NOTE — BH Assessment (Signed)
Comprehensive Clinical Assessment (CCA) Note  07/30/2022 Jim Anderson [id number]  Disposition:  Per Jim Heater, NP, patient is recommended for continuous assessment and to re-assess in the morning  The patient demonstrates the following risk factors for suicide: Chronic risk factors for suicide include: psychiatric disorder of depression and psychosis, substance use disorder, and previous suicide attempts x1 . Acute risk factors for suicide include: family or marital conflict and unemployment. Protective factors for this patient include: hope for the future. Considering these factors, the overall suicide risk at this point appears to be moderate. Patient is not appropriate for outpatient follow up.   PHQ2-9    Flowsheet Row ED from 07/30/2022 in [facility] Office Visit from 11/18/2020 in [facility]  PHQ-2 Total Score 4 5  PHQ-9 Total Score -- 24      Flowsheet Row ED from 07/30/2022 in [facility] Office Visit from 11/18/2020 in [facility]  C-SSRS RISK CATEGORY Moderate Risk Low Risk        Chief Complaint:  Chief Complaint  Patient presents with   Delusional   Suicidal   Visit Diagnosis: F29 Unspecified Psychosis   CCA Screening, Triage and Referral (STR)  Patient Reported Information How did you hear about [place]? Legal System  What Is the Reason for Your Visit/Call Today? 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  Pt reports passive SI, with a Anderson to choke himself on 07/29/22.  Pt reports hearing voices, "I am working for the [employer] and  [employer]; also, I have a microchip in my arm". Pt denies HI.  Pt admits to prior MH diagnosis or prescribed medication for symptom management.  MSE signed by patient.  Patient states that he was just released from jail two  days ago after serving an eleven month sentence for probation violation.  He has an extensive arrest history for larceny, B&E, Burglary and possession charged. Patient states that he has relapsed in the past two days and states that he smoked a blunt of marijuana and states that he has used $500 worth of methamphetamine.  Per Jim Heater, NP History Documented this date:  Patient is able to articulate his birthday as well as other demographic information.  He seems suspicious, states "if that is my birthday, if that is right."  Patient also reports paranoia surrounding his mother.  He states "my mother lives in [place] supposedly, that is another thing that I want to have someone investigate, I do not believe she is my real mom."   Jim Anderson endorses auditory hallucinations.  He states "I hear voices through my wrist, they tell me they are with federal agencies, the FBI, the DEA and police officers."  Patient holds left wrist toward ear, potentially responding to internal stimuli.  He also endorses paranoid ideation surrounding the voices in his head, reports "they put a program into my wrist to see if everything is being recorded."  He denies visual hallucinations today.  Endorses most recent episode of visual hallucinations 3 days ago when "I thought I saw a person turned into a spider, I did not see it close up so I cannot confirm."   Patient endorses methamphetamine use.  Most recent methamphetamine use 2 days ago when he used 20 g of methamphetamine.  He also endorses chronic, daily marijuana use.  Most recent marijuana use on yesterday.  He denies  alcohol use, denies substance use aside from marijuana and methamphetamine.  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  At this time he would refuse substance use treatment.  He states "I really do not want to stop, I make myself stop when I run out of money."   Patient denies suicidal and homicidal ideations.  He endorses suicidal ideation 11  months ago when he thought of "slitting my wrists."  He did not follow through with this Anderson.  He endorses history of nonsuicidal self-harm by punching himself when feeling frustrated.  Most recent episode of punching self 3 days ago.  Patient easily contracts verbally for safety with this Clinical research associate.   Jim Anderson is not linked with outpatient psychiatry.  He reports he received medications while in jail including Remeron and BuSpar.  He states "it was the generic version and I think it was making me sick."  Denies history of inpatient psychiatric hospitalization.  No family mental health history reported.   Patient is currently homeless in [place].  He is not employed.  He endorses average sleep and appetite.   Patient offered support and encouragement.  Reviewed treatment Anderson to include treatment and observation area and initiation of olanzapine 5 mg twice daily.  Reviewed side effects, patient offered opportunity ask questions.  He verbalizes agreement with Anderson.  How Long Has This Been Causing You Problems? 1 wk - 1 month  What Do You Feel Would Help You the Most Today? Treatment for Depression or other mood problem; Stress Management; Support for unsafe relationship; Housing Assistance   Have You Recently Had Any Thoughts About Hurting Yourself? Yes  Are You Planning to Commit Suicide/Harm Yourself At This time? No   Have you Recently Had Thoughts About Hurting Someone Jim Anderson? No  Are You Planning to Harm Someone at This Time? No  Explanation: No data recorded  Have You Used Any Alcohol or Drugs in the Past 24 Hours? Yes  How Long Ago Did You Use Drugs or Alcohol? No data recorded What Did You Use and How Much? Marijuana, Meth, cocaine   Do You Currently Have a Therapist/Psychiatrist? No data recorded Name of Therapist/Psychiatrist: No data recorded  Have You Been Recently Discharged From Any Office Practice or Programs? No data recorded Explanation of Discharge From  Practice/Program: No data recorded    CCA Screening Triage Referral Assessment Type of Contact: No data recorded Telemedicine Service Delivery:   Is this Initial or Reassessment? No data recorded Date Telepsych consult ordered in [facility]:  No data recorded Time Telepsych consult ordered in [facility]:  No data recorded Location of Assessment: No data recorded Provider Location: No data recorded  Collateral Involvement: No data recorded  Does Patient Have a Court Appointed Legal Guardian? No data recorded Legal Guardian Contact Information: No data recorded Copy of Legal Guardianship Form: No data recorded Legal Guardian Notified of Arrival: No data recorded Legal Guardian Notified of Pending Discharge: No data recorded If Minor and Not Living with Parent(s), Who has Custody? No data recorded Is CPS involved or ever been involved? No data recorded Is APS involved or ever been involved? No data recorded  Patient Determined To Be At Risk for Harm To Self or Others Based on Review of Patient Reported Information or Presenting Complaint? No data recorded Method: No data recorded Availability of Means: No data recorded Intent: No data recorded Notification Required: No data recorded Additional Information for Danger to Others Potential: No data recorded Additional Comments for Danger to Others Potential:  No data recorded Are There Guns or Other Weapons in [place]? No data recorded Types of Guns/Weapons: No data recorded Are These Weapons Safely Secured?                            No data recorded Who Could Verify You Are Able To Have These Secured: No data recorded Do You Have any Outstanding Charges, Pending Court Dates, Parole/Probation? No data recorded Contacted To Inform of Risk of Harm To Self or Others: No data recorded   Does Patient Present under Involuntary Commitment? No data recorded IVC Papers Initial File Date: No data recorded  [place] of Residence: No data recorded  Patient  Currently Receiving the Following Services: No data recorded  Determination of Need: Urgent (48 hours)   Options For Referral: Facility-Based Crisis     CCA Biopsychosocial Patient Reported Schizophrenia/Schizoaffective Diagnosis in Past: No   Strengths: No data recorded  Mental Health Symptoms Depression:   Difficulty Concentrating; Hopelessness   Duration of Depressive symptoms:  Duration of Depressive Symptoms: Greater than two weeks   Mania:   None   Anxiety:    None   Psychosis:   Hallucinations; Delusions   Duration of Psychotic symptoms:  Duration of Psychotic Symptoms: Greater than six months   Trauma:   Hypervigilance; Avoids reminders of event; Irritability/anger; Guilt/shame; Difficulty staying/falling asleep; Re-experience of traumatic event; Emotional numbing   Obsessions:   N/A   Compulsions:   N/A   Inattention:   N/A   Hyperactivity/Impulsivity:   N/A   Oppositional/Defiant Behaviors:   N/A   Emotional Irregularity:   N/A   Other Mood/Personality Symptoms:  No data recorded   Mental Status Exam Appearance and self-care  Stature:   Small   Weight:   Average weight   Clothing:   Age-appropriate   Grooming:   Normal   Cosmetic use:   None   Posture/gait:   Normal   Motor activity:   Not Remarkable   Sensorium  Attention:   Distractible   Concentration:   Preoccupied   Orientation:   X5   Recall/memory:   Normal   Affect and Mood  Affect:   Appropriate   Mood:   Depressed   Relating  Eye contact:   Normal   Facial expression:   Responsive   Attitude toward examiner:   Cooperative   Thought and Language  Speech flow:  Clear and Coherent   Thought content:   Appropriate to Mood and Circumstances   Preoccupation:   Suicide   Hallucinations:   Auditory   Organization:   Insurance account manager of Knowledge:   Fair   Intelligence:   Average   Abstraction:    Normal   Judgement:   Poor   Reality Testing:   Adequate   Insight:   Poor   Decision Making:   Impulsive   Social Functioning  Social Maturity:   Impulsive; Irresponsible   Social Judgement:   Heedless; Victimized; "Fish farm manager   Stress  Stressors:   Family conflict   Coping Ability:   Deficient supports   Skill Deficits:   Lobbyist   Supports:   Family     Religion: Religion/Spirituality Are You A Religious Person?: Yes What is Your Religious Affiliation?: Catholic  Leisure/Recreation: Leisure / Recreation Do You Have Hobbies?: Yes Leisure and Hobbies: "Soccer, fishing"  Exercise/Diet: Exercise/Diet Do You Exercise?: No Have You [employer]  or Lost A Significant Amount of Weight in the Past Six Months?: No Do You Follow a Special Diet?: No Do You Have Any Trouble Sleeping?: Yes Explanation of Sleeping Difficulties: Difficulties getting to sleep and staying asleep   CCA Employment/Education Employment/Work Situation: Employment / Work Situation Employment Situation: Unemployed Patient's Job has Been Impacted by Current Illness: No Has Patient ever Been in Equities trader?: No  Education: Education Is Patient Currently Attending School?: No Last Grade Completed: 11 Did You Product manager?: No Did You Have An Individualized Education Program (IIEP): Yes Did You Have Any Difficulty At School?: No Patient's Education Has Been Impacted by Current Illness: No   CCA Family/Childhood History Family and Relationship History: Family history Does patient have children?: No  Childhood History:  Childhood History By whom was/is the patient raised?: Mother Did patient suffer any verbal/emotional/physical/sexual abuse as a child?: No Did patient suffer from severe childhood neglect?: No Has patient ever been sexually abused/assaulted/raped as an adolescent or adult?: No Witnessed domestic violence?: No Has patient been affected by  domestic violence as an adult?: No  Child/Adolescent Assessment:     CCA Substance Use Alcohol/Drug Use: Alcohol / Drug Use Pain Medications: See MAR Prescriptions: See MAR Over the Counter: See MAR History of alcohol / drug use?: Yes Longest period of sobriety (when/how long): Pt unable to recall Negative Consequences of Use: Legal, Personal relationships, Work / Programmer, multimedia, Surveyor, quantity Withdrawal Symptoms: Aggressive/Assaultive, Irritability Substance #1 Name of Substance 1: Methamphetamine 1 - Age of First Use: 17 1 - Amount (size/oz): $5.00 worth on one occasion 1 - Frequency: has been incarcerated for 11 months, just got out two days ago and used once 1 - Duration: 1 day 1 - Last Use / Amount: $5.00 1 - Method of Aquiring: off the street 1- Route of Use: snorted Substance #2 Name of Substance 2: Cannabis 2 - Age of First Use: not assessed 2 - Amount (size/oz): 1 blunt 2 - Frequency: has been incarcerated for 11 months, just got out two days ago and used once 2 - Duration: Years 2 - Last Use / Amount: 1 blunt in past two days 2 - Method of Aquiring: off the street 2 - Route of Substance Use: smoked                     ASAM's:  Six Dimensions of Multidimensional Assessment  Dimension 1:  Acute Intoxication and/or Withdrawal Potential:   Dimension 1:  Description of individual's past and current experiences of substance use and withdrawal: Continued substance use despite negative consequences, no current withdrawal symptoms  Dimension 2:  Biomedical Conditions and Complications:   Dimension 2:  Description of patient's biomedical conditions and  complications: No current medical issues reported  Dimension 3:  Emotional, Behavioral, or Cognitive Conditions and Complications:  Dimension 3:  Description of emotional, behavioral, or cognitive conditions and complications: History of depression, suicidal ideation and hallucinations  Dimension 4:  Readiness to Change:   Dimension 4:  Description of Readiness to Change criteria: Pre-contemplation, does not appear motivated to make changes  Dimension 5:  Relapse, Continued use, or Continued Problem Potential:  Dimension 5:  Relapse, continued use, or continued problem potential critiera description: High risk for relapse, lacks coping skills to prevent relapse  Dimension 6:  Recovery/Living Environment:  Dimension 6:  Recovery/Iiving environment criteria description: Patient is currently homeless with minimal support  ASAM Severity Score: ASAM's Severity Rating Score: 14  ASAM Recommended Level of Treatment: ASAM  Recommended Level of Treatment: Level III Residential Treatment   Substance use Disorder (SUD) Substance Use Disorder (SUD)  Checklist Symptoms of Substance Use: Continued use despite having a persistent/recurrent physical/psychological problem caused/exacerbated by use, Continued use despite persistent or recurrent social, interpersonal problems, caused or exacerbated by use, Evidence of tolerance, Large amounts of time spent to obtain, use or recover from the substance(s), Persistent desire or unsuccessful efforts to cut down or control use, Presence of craving or strong urge to use, Recurrent use that results in a failure to fulfill major role obligations (work, school, home), Repeated use in physically hazardous situations, Social, occupational, recreational activities given up or reduced due to use, Substance(s) often taken in larger amounts or over longer times than was intended  Recommendations for Services/Supports/Treatments: Recommendations for Services/Supports/Treatments Recommendations For Services/Supports/Treatments: Residential-Level 3  Discharge Disposition:    DSM5 Diagnoses: Patient Active Problem List   Diagnosis Date Noted   Psychosis (HCC) 07/30/2022     Referrals to Alternative Service(s): Referred to Alternative Service(s):   Place:   Date:   Time:    Referred to Alternative  Service(s):   Place:   Date:   Time:    Referred to Alternative Service(s):   Place:   Date:   Time:    Referred to Alternative Service(s):   Place:   Date:   Time:     Margarethe Virgen J Inika Bellanger, LCAS

## 2022-07-30 NOTE — BH Assessment (Signed)
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  Pt reports passive SI, with a plan to choke himself on 07/29/22.  Pt reports hearing voices, "I am working for the [employer]; also, I have a microchip in my arm". Pt denies HI.  Pt admits to prior MH diagnosis or prescribed medication for symptom management.  MSE signed by patient.

## 2022-07-30 NOTE — ED Notes (Signed)
Pt sleeping at present, no distress noted.  Monitoring for safety. 

## 2022-07-30 NOTE — ED Provider Notes (Signed)
[facility] Urgent Care Continuous Assessment Admission H&P  Date: 07/30/22 Patient Name: Jim Anderson MRN: [id number] Chief Complaint:  Chief Complaint  Patient presents with   Delusional   Suicidal      Diagnoses:  Final diagnoses:  Acute psychosis ([place])  Methamphetamine use disorder, severe ([place])    HPI: Presents voluntarily to [facility] behavioral health for walk-in assessment.  Patient transported by police.  Staff, at the interactive resource center for the homeless, contacted law enforcement for patient transportation related to patient's report of auditory and visual hallucinations.  Social work Wellsite geologist, Jim Anderson, presents to [facility] behavioral health to provide collateral information.  Jim Anderson is seated in assessment area, no apparent distress.  He is assessed, face-to-face, by nurse practitioner.  He is alert and oriented, pleasant and cooperative during assessment.  He presents with euthymic mood, congruent affect.  Recent stressors include being released from jail 3 days ago.  He was jailed for 11 months after missing probation appointments and burglary.  Patient reports he is currently homeless in [place].  He states "I would like a job, I am looking for employment, I would like housing close by because I walk."  Patient is able to articulate his birthday as well as other demographic information.  He seems suspicious, states "if that is my birthday, if that is right."  Patient also reports paranoia surrounding his mother.  He states "my mother lives in [place] supposedly, that is another thing that I want to have someone investigate, I do not believe she is my real mom."  Jim Anderson endorses auditory hallucinations.  He states "I hear voices through my wrist, they tell me they are with federal agencies, the Jim Anderson, the DEA and police officers."  Patient holds left wrist toward ear, potentially responding to internal stimuli.  He also endorses paranoid ideation  surrounding the voices in his head, reports "they put a program into my wrist to see if everything is being recorded."  He denies visual hallucinations today.  Endorses most recent episode of visual hallucinations 3 days ago when "I thought I saw a person turned into a spider, I did not see it close up so I cannot confirm."  Patient endorses methamphetamine use.  Most recent methamphetamine use 2 days ago when he used 20 g of methamphetamine.  He also endorses chronic, daily marijuana use.  Most recent marijuana use on yesterday.  He denies alcohol use, denies substance use aside from marijuana and methamphetamine.  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  At this time he would refuse substance use treatment.  He states "I really do not want to stop, I make myself stop when I run out of money."  Patient denies suicidal and homicidal ideations.  He endorses suicidal ideation 11 months ago when he thought of "slitting my wrists."  He did not follow through with this Anderson.  He endorses history of nonsuicidal self-harm by punching himself when feeling frustrated.  Most recent episode of punching self 3 days ago.  Patient easily contracts verbally for safety with this Probation officer.  Jim Anderson is not linked with outpatient psychiatry.  He reports he received medications while in jail including Remeron and BuSpar.  He states "it was the generic version and I think it was making me sick."  Denies history of inpatient psychiatric hospitalization.  No family mental health history reported.  Patient is currently homeless in [place].  He is not employed.  He endorses average sleep and appetite.  Patient offered support  and encouragement.  Reviewed treatment Anderson to include treatment and observation area and initiation of olanzapine 5 mg twice daily.  Reviewed side effects, patient offered opportunity ask questions.  He verbalizes agreement with Anderson.   He gives verbal consent to speak with Jim Anderson,  staff member with social work team at [facility]. Jim Anderson shares that patient endorsed delusional thought content and paranoia earlier this date.  Jim Anderson told Jim Anderson "there is a microchip in my wrist I believe I am an FBI agent, the FBI talks through the microchip."  Jim Anderson also reported to [facility] staff "sometimes FBI agents tell me to hurt myself and others." Jim Anderson has individual counseling appointment scheduled for 08/04/2022 at 9 AM with Jim Anderson at the [facility].  Jim Anderson gives verbal consent to speak with his mother, Jim Anderson, phone number [phone]. Spoke with patient's mother who states "I think everything started with his drug use."  She is not aware of any previous mental health diagnosis or treatment. No family mental health history per her report.    PHQ 2-9:  Flowsheet Row Office Visit from 11/18/2020 in [facility]  Thoughts that you would be better off dead, or of hurting yourself in some way Several days  PHQ-9 Total Score 24       Flowsheet Row Office Visit from 11/18/2020 in [facility]  C-SSRS RISK CATEGORY Low Risk        Total Time spent with patient: 30 minutes  Musculoskeletal  Strength & Muscle Tone: within normal limits Gait & Station: normal Patient leans: N/A  Psychiatric Specialty Exam  Presentation General Appearance:  Appropriate for Environment; Casual  Eye Contact: Good  Speech: Clear and Coherent; Normal Rate  Speech Volume: Normal  Handedness: Right   Mood and Affect  Mood: Euthymic  Affect: Congruent; Appropriate   Thought Process  Thought Processes: Goal Directed; Coherent  Descriptions of Associations:Tangential  Orientation:Full (Time, Place and Person)  Thought Content:Tangential; Paranoid Ideation; Delusions  Diagnosis of Schizophrenia or Schizoaffective disorder in past: No  Duration of Psychotic Symptoms: Greater than six  months  Hallucinations:Hallucinations: Auditory Description of Auditory Hallucinations: I hear the voices of federal agents, the FBI, the DEA, and poor lease officer.  They say stuff out of my wrist to see if it is all being recorded."  Ideas of Reference:Delusions; Paranoia  Suicidal Thoughts:Suicidal Thoughts: No  Homicidal Thoughts:Homicidal Thoughts: No   Sensorium  Memory: Immediate Fair  Judgment: Impaired  Insight: Lacking   Executive Functions  Concentration: Fair  Attention Span: Good  Recall: Good  Fund of Knowledge: Good  Language: Good   Psychomotor Activity  Psychomotor Activity: Psychomotor Activity: Normal   Assets  Assets: Communication Skills; Desire for Improvement; Resilience; Physical Health   Sleep  Sleep: Sleep: Fair   Nutritional Assessment (For OBS and FBC admissions only) Has the patient had a weight loss or gain of 10 pounds or more in the last 3 months?: No Has the patient had a decrease in food intake/or appetite?: No Does the patient have dental problems?: No Does the patient have eating habits or behaviors that may be indicators of an eating disorder including binging or inducing vomiting?: No Has the patient recently lost weight without trying?: 0 Has the patient been eating poorly because of a decreased appetite?: 0 Malnutrition Screening Tool Score: 0    Physical Exam Vitals and nursing note reviewed.  Constitutional:      Appearance: Normal appearance. He is well-developed and  normal weight.  HENT:     Head: Normocephalic and atraumatic.     Nose: Nose normal.  Cardiovascular:     Rate and Rhythm: Normal rate.  Pulmonary:     Effort: Pulmonary effort is normal.  Musculoskeletal:        General: Normal range of motion.     Cervical back: Normal range of motion.  Skin:    General: Skin is warm and dry.  Neurological:     Mental Status: He is alert and oriented to person, place, and time.  Psychiatric:         Attention and Perception: Attention normal. He perceives auditory and visual hallucinations.        Mood and Affect: Mood and affect normal.        Speech: Speech is tangential.        Behavior: Behavior normal. Behavior is cooperative.        Thought Content: Thought content is paranoid and delusional.        Cognition and Memory: Cognition normal.        Judgment: Judgment is inappropriate.    Review of Systems  Constitutional: Negative.   HENT: Negative.    Eyes: Negative.   Respiratory: Negative.    Cardiovascular: Negative.   Gastrointestinal: Negative.   Genitourinary: Negative.   Musculoskeletal: Negative.   Skin: Negative.   Neurological: Negative.   Psychiatric/Behavioral:  Positive for hallucinations and substance abuse.     Blood pressure 121/78, pulse 99, temperature 98.7 F (37.1 C), temperature source Oral, resp. rate 18, SpO2 100 %. There is no height or weight on file to calculate BMI.  Past Psychiatric History:  polysubstance abuse  Is the patient at risk to self? No  Has the patient been a risk to self in the past 6 months? No .    Has the patient been a risk to self within the distant past? Yes   Is the patient a risk to others? No   Has the patient been a risk to others in the past 6 months? No   Has the patient been a risk to others within the distant past? No   Past Medical History:  Past Medical History:  Diagnosis Date   Seizures ([place])    No past surgical history on file.  Family History: No family history on file.  Social History:  Social History   Socioeconomic History   Marital status: Married    Spouse name: Not on file   Number of children: Not on file   Years of education: Not on file   Highest education level: Not on file  Occupational History   Not on file  Tobacco Use   Smoking status: Every Day   Smokeless tobacco: Not on file  Substance and Sexual Activity   Alcohol use: Yes   Drug use: Yes    Types:  Methamphetamines, Marijuana   Sexual activity: Not on file  Other Topics Concern   Not on file  Social History Narrative   Not on file   Social Determinants of Health   Financial Resource Strain: Not on file  Food Insecurity: Not on file  Transportation Needs: Not on file  Physical Activity: Not on file  Stress: Not on file  Social Connections: Not on file  Intimate Partner Violence: Not on file    SDOH:  SDOH Screenings   Depression (PHQ2-9): Medium Risk (11/18/2020)  Tobacco Use: High Risk (11/14/2020)    Last Labs:  No visits  with results within 6 Month(s) from this visit.  Latest known visit with results is:  Admission on 12/24/2019, Discharged on 12/24/2019  Component Date Value Ref Range Status   Glucose-Capillary 12/24/2019 83  70 - 99 mg/dL Final   Glucose reference range applies only to samples taken after fasting for at least 8 hours.   WBC 12/24/2019 6.3  4.0 - 10.5 K/uL Final   RBC 12/24/2019 5.07  4.22 - 5.81 MIL/uL Final   Hemoglobin 12/24/2019 15.5  13.0 - 17.0 g/dL Final   HCT 12/24/2019 47.0  39.0 - 52.0 % Final   MCV 12/24/2019 92.7  80.0 - 100.0 fL Final   MCH 12/24/2019 30.6  26.0 - 34.0 pg Final   MCHC 12/24/2019 33.0  30.0 - 36.0 g/dL Final   RDW 12/24/2019 12.9  11.5 - 15.5 % Final   Platelets 12/24/2019 105 (L)  150 - 400 K/uL Final   Comment: REPEATED TO VERIFY PLATELET COUNT CONFIRMED BY SMEAR SPECIMEN CHECKED FOR CLOTS    nRBC 12/24/2019 0.0  0.0 - 0.2 % Final   Neutrophils Relative % 12/24/2019 54  % Final   Neutro Abs 12/24/2019 3.4  1.7 - 7.7 K/uL Final   Lymphocytes Relative 12/24/2019 29  % Final   Lymphs Abs 12/24/2019 1.8  0.7 - 4.0 K/uL Final   Monocytes Relative 12/24/2019 11  % Final   Monocytes Absolute 12/24/2019 0.7  0.1 - 1.0 K/uL Final   Eosinophils Relative 12/24/2019 1  % Final   Eosinophils Absolute 12/24/2019 0.1  0.0 - 0.5 K/uL Final   Basophils Relative 12/24/2019 1  % Final   Basophils Absolute 12/24/2019 0.0  0.0 -  0.1 K/uL Final   Immature Granulocytes 12/24/2019 4  % Final   Abs Immature Granulocytes 12/24/2019 0.27 (H)  0.00 - 0.07 K/uL Final   Performed at [facility], [place] [address] [place], [place] [address]   Alcohol, Ethyl (B) 12/24/2019 <10  <10 mg/dL Final   Comment: (NOTE) Lowest detectable limit for serum alcohol is 10 mg/dL. For medical purposes only. Performed at [facility], [place] [address], [place], [place] [address]    Color, Urine 12/24/2019 YELLOW  YELLOW Final   APPearance 12/24/2019 CLOUDY (A)  CLEAR Final   Specific Gravity, Urine 12/24/2019 1.019  1.005 - 1.030 Final   pH 12/24/2019 5.0  5.0 - 8.0 Final   Glucose, UA 12/24/2019 NEGATIVE  NEGATIVE mg/dL Final   Hgb urine dipstick 12/24/2019 SMALL (A)  NEGATIVE Final   Bilirubin Urine 12/24/2019 NEGATIVE  NEGATIVE Final   Ketones, ur 12/24/2019 20 (A)  NEGATIVE mg/dL Final   Protein, ur 12/24/2019 30 (A)  NEGATIVE mg/dL Final   Nitrite 12/24/2019 NEGATIVE  NEGATIVE Final   Leukocytes,Ua 12/24/2019 NEGATIVE  NEGATIVE Final   RBC / HPF 12/24/2019 0-5  0 - 5 RBC/hpf Final   WBC, UA 12/24/2019 0-5  0 - 5 WBC/hpf Final   Bacteria, UA 12/24/2019 RARE (A)  NONE SEEN Final   Squamous Epithelial / LPF 12/24/2019 0-5  0 - 5 Final   Mucus 12/24/2019 PRESENT   Final   Performed at [facility], [place] [address], [place], [place] [address]   Opiates 12/24/2019 NONE DETECTED  NONE DETECTED Final   Cocaine 12/24/2019 NONE DETECTED  NONE DETECTED Final   Benzodiazepines 12/24/2019 NONE DETECTED  NONE DETECTED Final   Amphetamines 12/24/2019 POSITIVE (A)  NONE DETECTED Final   Tetrahydrocannabinol 12/24/2019 POSITIVE (A)  NONE DETECTED  Final   Barbiturates 12/24/2019 NONE DETECTED  NONE DETECTED Final   Comment: (NOTE) DRUG SCREEN FOR MEDICAL PURPOSES ONLY.  IF CONFIRMATION IS NEEDED FOR ANY PURPOSE, NOTIFY LAB WITHIN 5 DAYS. LOWEST DETECTABLE LIMITS FOR URINE DRUG SCREEN Drug Class                     Cutoff  (ng/mL) Amphetamine and metabolites    1000 Barbiturate and metabolites    200 Benzodiazepine                 [id number] Tricyclics and metabolites     300 Opiates and metabolites        300 Cocaine and metabolites        300 THC                            50 Performed at [facility], [place] [address] [place], [place] [address]    Magnesium 12/24/2019 2.3  1.7 - 2.4 mg/dL Final   Performed at [facility] [address], [place], [place] [address]   Sodium 12/24/2019 141  135 - 145 mmol/L Final   Potassium 12/24/2019 3.3 (L)  3.5 - 5.1 mmol/L Final   Chloride 12/24/2019 107  98 - 111 mmol/L Final   CO2 12/24/2019 20 (L)  22 - 32 mmol/L Final   Glucose, Bld 12/24/2019 97  70 - 99 mg/dL Final   Glucose reference range applies only to samples taken after fasting for at least 8 hours.   BUN 12/24/2019 9  6 - 20 mg/dL Final   Creatinine, Ser 12/24/2019 1.16  0.61 - 1.24 mg/dL Final   Calcium 12/24/2019 9.1  8.9 - 10.3 mg/dL Final   Total Protein 12/24/2019 7.2  6.5 - 8.1 g/dL Final   Albumin 12/24/2019 4.1  3.5 - 5.0 g/dL Final   AST 12/24/2019 144 (H)  15 - 41 U/L Final   ALT 12/24/2019 316 (H)  0 - 44 U/L Final   Alkaline Phosphatase 12/24/2019 56  38 - 126 U/L Final   Total Bilirubin 12/24/2019 1.4 (H)  0.3 - 1.2 mg/dL Final   GFR calc non Af Amer 12/24/2019 >60  >60 mL/min Final   GFR calc Af Amer 12/24/2019 >60  >60 mL/min Final   Anion gap 12/24/2019 14  5 - 15 Final   Performed at [facility] [address] [place], [place] [address]    Allergies: Shrimp (diagnostic)  PTA Medications: (Not in a hospital admission)   Medical Decision Making  Patient remains voluntary.  He will be placed in observation area for treatment and stabilization.  He will be reevaluated on 07/31/2022, disposition will be determined at that time.  Laboratory studies ordered including CBC, CMP, ethanol, A1c, lipid panel, magnesium, prolactin and TSH.  Urine drug screen order initiated.    EKG ordered/completed. QT/QTc measures 370/434.  Current medications: -Acetaminophen 650 mg every 6 as needed/mild pain -Maalox 30 mL oral every 4 as needed/digestion -Hydroxyzine 25 mg 3 times daily as needed/anxiety -Magnesium hydroxide 30 mL daily as needed/mild constipation -Trazodone 50 mg nightly as needed/sleep -olanzapine 5mg  BID/mood    Recommendations  Based on my evaluation the patient does not appear to have an emergency medical condition.  Lucky Rathke, FNP 07/30/22  1:22 PM

## 2022-07-31 ENCOUNTER — Other Ambulatory Visit (HOSPITAL_COMMUNITY): Payer: Self-pay

## 2022-07-31 LAB — PROLACTIN: Prolactin: 4.8 ng/mL (ref 4.0–15.2)

## 2022-07-31 MED ORDER — OLANZAPINE 5 MG PO TBDP
5.0000 mg | ORAL_TABLET | Freq: Two times a day (BID) | ORAL | 0 refills | Status: DC
  Filled 2022-07-31: qty 60, 30d supply, fill #0

## 2022-07-31 NOTE — ED Notes (Signed)
Pt sleeping at present, no distress noted.  Monitoring for safety. 

## 2022-07-31 NOTE — Discharge Instructions (Addendum)
Patient is instructed prior to discharge to:  Take all medications as prescribed by his/her mental healthcare provider. Report any adverse effects and or reactions from the medicines to his/her outpatient provider promptly. Keep all scheduled appointments, to ensure that you are getting refills on time and to avoid any interruption in your medication.  If you are unable to keep an appointment call to reschedule.  Be sure to follow-up with resources and follow-up appointments provided.  Patient has been instructed & cautioned: To not engage in alcohol and or illegal drug use while on prescription medicines. In the event of worsening symptoms, patient is instructed to call the crisis hotline, 911 and or go to the nearest ED for appropriate evaluation and treatment of symptoms. To follow-up with his/her primary care provider for your other medical issues, concerns and or health care needs.  Information: -National Suicide Prevention Lifeline 1-800-SUICIDE or 226-538-7606.  -988 offers 24/7 access to trained crisis counselors who can help people experiencing mental health-related distress. People can call or text 988 or chat 988lifeline.org for themselves or if they are worried about a loved one who may need crisis support.      Substance Abuse Treatment Resources listed Below:  [facility] - Admissions are currently completed Monday through Friday at [facility]; both appointments and walk-ins are accepted.  Any individual that is a [facility] resident may present for a substance abuse screening and assessment for admission.  A person may be referred by numerous sources or self-refer.   Potential clients will be screened for medical necessity and appropriateness for the program.  Clients must meet criteria for high-intensity residential treatment services.  If clinically appropriate, a client will continue with the comprehensive clinical assessment and intake process, as well  as enrollment in the [place].  Address: [address] [place], [place] [address] Admin Hours: [place] to [place] Hours: 24/7 Phone: [phone] Fax: [place] Address: [place], [place], [place] [address] Behavioral Health Urgent Care [facility]) Hours: 24/7 Phone: [phone] Fax: [phone]  [facility] ([facility]): (offers outpatient therapy and intensive outpatient substance abuse therapy).  [address] [place], [place] [address] Phone: [phone]  [facility]: Offers FREE recovery skills classes, support groups, 1:1 Peer Support, and Compeer Classes. [address], [place], [place] [address] Phone: [phone] (Call to complete intake).   [facility] [place], [place] [address] Phone: [phone] ext [phone] [facility] provides food, shelter and other programs and services to the homeless men of [place] through our [employer] program.  By offering safe shelter, three meals a day, clean clothing, Biblical counseling, financial planning, vocational training, GED/education and employment assistance, we've helped mend the shattered lives of many homeless men since opening in 1974.  We have approximately 267 beds available, with a max of 312 beds including mats for emergency situations and currently house an average of 270 men a night.  Prospective Client Check-In Information Photo ID Required (State/ Out of State/ [facility]) - if photo ID is not available, clients are required to have a printout of a police/sheriff's criminal history report. Help out with chores around the [place]. No sex offender of any type (pending, charged, registered and/or any other sex related offenses) will be permitted to check in. Must be willing to abide by all rules, regulations, and policies established by the [employer]. The  following  will be provided - shelter, food, clothing, and biblical counseling. If you or someone you know is in need of assistance at our men's shelter in [place], [place], please call [phone] ext. [phone].  [facility]-will provide timely access to mental health services for children and adolescents (4-17) and adults presenting in a mental health crisis. The program is designed for those who need urgent Behavioral Health or Substance Use treatment and are not experiencing a medical crisis that would typically require an emergency room visit.    [address] [place], [place] [address] Phone: [phone] [email or web address]  [facility]: Phone#: [phone]  The [facility] SA Intensive Outpatient Program (SAIOP) means structured individual and group addiction activities and services that are provided at an outpatient program designed to assist adult and adolescent consumers to begin recovery and learn skills for recovery maintenance. The [facility] SAIOP program is offered at least 3 hours a day, 3 days a week.SAIOP services shall include a structured program consisting of, but not limited to, the following services: Individual counseling and support; Group counseling and support; Family counseling, training or support; Biochemical assays to identify recent drug use (e.g., urine drug screens); Strategies for relapse prevention to include community and social support systems in treatment; Life skills; Crisis contingency planning; Disease Management; and Treatment support activities that have been adapted or specifically designed for persons with physical disabilities, or persons with co-occurring disorders of mental illness and substance abuse/dependence or mental retardation/developmental disability and substance abuse/dependence. Phone: [phone]  Address:   The [facility] will also offer the following outpatient  services: (Monday through Friday 8am-5pm)   Partial Hospitalization Program (PHP) Substance Abuse Intensive Outpatient Program (SA-IOP) Group Therapy Medication Management Peer Living Room We also provide (24/7):  Assessments: Our mental health clinician and providers will conduct a focused mental health evaluation, assessing for immediate safety concerns and further mental health needs. Referral: Our team will provide resources and help connect to community based mental health treatment, when indicated, including psychotherapy, psychiatry, and other specialized behavioral health or substance use disorder services (for those not already in treatment). Transitional Care: Our team providers in person bridging and/or telephonic follow-up during the patient's transition to outpatient services.  [facility] 24-Hour Call Center: 1-800-256-2452 Behavioral Health Crisis Line: [phone]  

## 2022-07-31 NOTE — ED Provider Notes (Signed)
FBC/OBS ASAP Discharge Summary  Date and Time: 07/31/2022 3:51 PM  Name: Jim Anderson  MRN:  [id number]   Discharge Diagnoses:  Final diagnoses:  Acute psychosis (HCC)  Methamphetamine use disorder, severe (HCC)    Subjective: Patient states "I am ready to get back to the [facility]."  He reports "I am waiting on a new home and money assistance, my main goal is to get a job."  Patient is reassessed, face-to-face, by Publishing rights manager.  He is seated in observation area, no apparent distress.  He is alert and oriented, pleasant and cooperative during assessment.  He presents with euthymic mood, congruent affect.  He has worked in [employer], would like to move into a less strenuous type of work.  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  Patient shares that he is concerned that tattoos on his face cause people who could potentially employ him to move on to other candidates.  Florian is committed to his sobriety with plans to abstain from all alcohol and substance use moving forward.  Reviewed residential substance use treatment options, patient declines at this time.  Prefers to return to [facility].  He continues to deny suicidal and homicidal ideations.  He denies auditory and visual hallucinations.  There is no indication that patient is responding to internal stimuli.  Patient continues to make vague reference to delusion regarding employment as an FBI agent. He is insightful today, verbalizes he believed that he worked for the [employer] when he was in jail.  Patient offered support and encouragement.  Verbalizes plan to follow-up with outpatient psychiatry at [facility] behavioral health.  Stay Summary HPI completed on 07/30/2022 at 1320pm: Presents voluntarily to [facility] behavioral health for walk-in assessment.  Patient transported by police.  Staff, at the [facility] the [facility], contacted law enforcement for patient transportation related to  patient's report of auditory and visual hallucinations.  Social work Technical brewer, [name], presents to [facility] behavioral health to provide collateral information.   Charls is seated in assessment area, no apparent distress.  He is assessed, face-to-face, by nurse practitioner.  He is alert and oriented, pleasant and cooperative during assessment.  He presents with euthymic mood, congruent affect.   Recent stressors include being released from jail 3 days ago.  He was jailed for 11 months after missing probation appointments and burglary.  Patient reports he is currently homeless in [place].  He states "I would like a job, I am looking for employment, I would like housing close by because I walk."   Patient is able to articulate his birthday as well as other demographic information.  He seems suspicious, states "if that is my birthday, if that is right."  Patient also reports paranoia surrounding his mother.  He states "my mother lives in [place] supposedly, that is another thing that I want to have someone investigate, I do not believe she is my real mom."   Kiron endorses auditory hallucinations.  He states "I hear voices through my wrist, they tell me they are with federal agencies, the FBI, the DEA and police officers."  Patient holds left wrist toward ear, potentially responding to internal stimuli.  He also endorses paranoid ideation surrounding the voices in his head, reports "they put a program into my wrist to see if everything is being recorded."  He denies visual hallucinations today.  Endorses most recent episode of visual hallucinations 3 days ago when "I thought I saw a person turned into  a spider, I did not see it close up so I cannot confirm."   Patient endorses methamphetamine use.  Most recent methamphetamine use 2 days ago when he used 20 g of methamphetamine.  He also endorses chronic, daily marijuana use.  Most recent marijuana use on yesterday.  He denies  alcohol use, denies substance use aside from marijuana and methamphetamine.  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  At this time he would refuse substance use treatment.  He states "I really do not want to stop, I make myself stop when I run out of money."   Patient denies suicidal and homicidal ideations.  He endorses suicidal ideation 11 months ago when he thought of "slitting my wrists."  He did not follow through with this plan.  He endorses history of nonsuicidal self-harm by punching himself when feeling frustrated.  Most recent episode of punching self 3 days ago.  Patient easily contracts verbally for safety with this Probation officer.   Nehal is not linked with outpatient psychiatry.  He reports he received medications while in jail including Remeron and BuSpar.  He states "it was the generic version and I think it was making me sick."  Denies history of inpatient psychiatric hospitalization.  No family mental health history reported.   Patient is currently homeless in [place].  He is not employed.  He endorses average sleep and appetite.   Patient offered support and encouragement.  Reviewed treatment plan to include treatment and observation area and initiation of olanzapine 5 mg twice daily.  Reviewed side effects, patient offered opportunity ask questions.  He verbalizes agreement with plan.    He gives verbal consent to speak with Shari Heritage, staff member with social work team at [facility]. Shari Heritage shares that patient endorsed delusional thought content and paranoia earlier this date.  Athony told Anderson Malta "there is a microchip in my wrist I believe I am an FBI agent, the FBI talks through the microchip."  Mohammedali also reported to [facility] staff "sometimes FBI agents tell me to hurt myself and others." Aikeem has individual counseling appointment scheduled for 08/04/2022 at 9 AM with Shari Heritage at the [facility].   Cledis gives verbal consent to speak with his mother, Consuello Closs,  phone number [phone]. Spoke with patient's mother who states "I think everything started with his drug use."  She is not aware of any previous mental health diagnosis or treatment. No family mental health history per her report.   Total Time spent with patient: 20 minutes  Past Psychiatric History: see above Past Medical History:  Past Medical History:  Diagnosis Date   Seizures ([place])    History reviewed. No pertinent surgical history. Family History: History reviewed. No pertinent family history. Family Psychiatric History: see above Social History:  Social History   Substance and Sexual Activity  Alcohol Use Yes     Social History   Substance and Sexual Activity  Drug Use Yes   Types: Methamphetamines, Marijuana    Social History   Socioeconomic History   Marital status: Married    Spouse name: Not on file   Number of children: Not on file   Years of education: Not on file   Highest education level: Not on file  Occupational History   Not on file  Tobacco Use   Smoking status: Every Day   Smokeless tobacco: Not on file  Substance and Sexual Activity   Alcohol use: Yes   Drug use: Yes    Types: Methamphetamines,  Marijuana   Sexual activity: Not on file  Other Topics Concern   Not on file  Social History Narrative   Not on file   Social Determinants of Health   Financial Resource Strain: Not on file  Food Insecurity: Not on file  Transportation Needs: Not on file  Physical Activity: Not on file  Stress: Not on file  Social Connections: Not on file   SDOH:  SDOH Screenings   Depression (PHQ2-9): High Risk (07/30/2022)  Tobacco Use: High Risk (07/30/2022)    Tobacco Cessation:  A prescription for an FDA-approved tobacco cessation medication was offered at discharge and the patient refused  Current Medications:  Current Facility-Administered Medications  Medication Dose Route Frequency Provider Last Rate Last Admin   acetaminophen (TYLENOL) tablet  650 mg  650 mg Oral Q6H PRN Lenard Lance, FNP       alum & mag hydroxide-simeth (MAALOX/MYLANTA) 200-200-20 MG/5ML suspension 30 mL  30 mL Oral Q4H PRN Lenard Lance, FNP       hydrOXYzine (ATARAX) tablet 25 mg  25 mg Oral TID PRN Lenard Lance, FNP       magnesium hydroxide (MILK OF MAGNESIA) suspension 30 mL  30 mL Oral Daily PRN Lenard Lance, FNP       OLANZapine zydis (ZYPREXA) disintegrating tablet 5 mg  5 mg Oral BID Lenard Lance, FNP   5 mg at 07/31/22 1049   traZODone (DESYREL) tablet 50 mg  50 mg Oral QHS PRN Lenard Lance, FNP   50 mg at 07/30/22 2153   No current outpatient medications on file.    PTA Medications: (Not in a hospital admission)      07/30/2022    1:31 PM 11/18/2020    2:43 PM  Depression screen PHQ 2/9  Decreased Interest 2 2  Down, Depressed, Hopeless 2 3  PHQ - 2 Score 4 5  Altered sleeping  3  Tired, decreased energy 0 3  Change in appetite 1 3  Feeling bad or failure about yourself  2 3  Trouble concentrating 2 3  Moving slowly or fidgety/restless 2 3  Suicidal thoughts 2 1  PHQ-9 Score  24  Difficult doing work/chores Very difficult Very difficult    Flowsheet Row ED from 07/30/2022 in [facility] Office Visit from 11/18/2020 in [facility]  C-SSRS RISK CATEGORY High Risk Low Risk       Musculoskeletal  Strength & Muscle Tone: within normal limits Gait & Station: normal Patient leans: N/A  Psychiatric Specialty Exam  Presentation  General Appearance:  Appropriate for Environment; Casual  Eye Contact: Good  Speech: Clear and Coherent; Normal Rate  Speech Volume: Normal  Handedness: Right   Mood and Affect  Mood: Euthymic  Affect: Congruent   Thought Process  Thought Processes: Coherent; Goal Directed; Linear  Descriptions of Associations:Intact  Orientation:Full (Time, Place and Person)  Thought Content:Delusions  Diagnosis of Schizophrenia or  Schizoaffective disorder in past: No  Duration of Psychotic Symptoms: Greater than six months   Hallucinations:Hallucinations: None Description of Auditory Hallucinations: I hear the voices of federal agents, the FBI, the DEA, and poor lease officer.  They say stuff out of my wrist to see if it is all being recorded."  Ideas of Reference:Delusions  Suicidal Thoughts:Suicidal Thoughts: No  Homicidal Thoughts:Homicidal Thoughts: No   Sensorium  Memory: Immediate Good  Judgment: Fair  Insight: Fair   Executive Functions  Concentration: Good  Attention  Span: Good  Recall: Good  Fund of Knowledge: Good  Language: Good   Psychomotor Activity  Psychomotor Activity: Psychomotor Activity: Normal   Assets  Assets: Communication Skills; Desire for Improvement; Physical Health; Resilience; Social Support   Sleep  Sleep: Sleep: Good   Nutritional Assessment (For OBS and FBC admissions only) Has the patient had a weight loss or gain of 10 pounds or more in the last 3 months?: No Has the patient had a decrease in food intake/or appetite?: No Does the patient have dental problems?: No Does the patient have eating habits or behaviors that may be indicators of an eating disorder including binging or inducing vomiting?: No Has the patient recently lost weight without trying?: 0 Has the patient been eating poorly because of a decreased appetite?: 0 Malnutrition Screening Tool Score: 0    Physical Exam  Physical Exam Vitals and nursing note reviewed.  Constitutional:      Appearance: Normal appearance. He is well-developed.  HENT:     Head: Normocephalic and atraumatic.  Cardiovascular:     Rate and Rhythm: Normal rate.  Pulmonary:     Effort: Pulmonary effort is normal.  Musculoskeletal:        General: Normal range of motion.     Cervical back: Normal range of motion.  Skin:    General: Skin is warm and dry.  Neurological:     Mental Status: He is  alert and oriented to person, place, and time.  Psychiatric:        Attention and Perception: Attention and perception normal.        Mood and Affect: Mood and affect normal.        Speech: Speech normal.        Behavior: Behavior normal. Behavior is cooperative.        Thought Content: Thought content is delusional.        Cognition and Memory: Cognition normal.    Review of Systems  Constitutional: Negative.   HENT: Negative.    Eyes: Negative.   Respiratory: Negative.    Cardiovascular: Negative.   Gastrointestinal: Negative.   Genitourinary: Negative.   Musculoskeletal: Negative.   Skin: Negative.   Neurological: Negative.   Psychiatric/Behavioral:  Positive for substance abuse.    Blood pressure 99/84, pulse 92, temperature 97.9 F (36.6 C), temperature source Oral, resp. rate 16, SpO2 100 %. There is no height or weight on file to calculate BMI.  Demographic Factors:  Male and Unemployed  Loss Factors: Financial problems/change in socioeconomic status  Historical Factors: NA  Risk Reduction Factors:   Positive social support, Positive therapeutic relationship, and Positive coping skills or problem solving skills  Continued Clinical Symptoms:  Alcohol/Substance Abuse/Dependencies  Cognitive Features That Contribute To Risk:  None    Suicide Risk:  Minimal: No identifiable suicidal ideation.  Patients presenting with no risk factors but with morbid ruminations; may be classified as minimal risk based on the severity of the depressive symptoms  Plan Of Care/Follow-up recommendations:   Labs reviewed resulted 07/30/2022 CMP-glucose 103 Lipid Profile- HDL 31 CBC- platelets 118 [id number] UDS positive for amphetamines and THC Ethanol less than 10 Respiratory Panel-influenza A and B negative, COVID negative   Patient reviewed with Dr. Nelly Rout. Follow-up with outpatient psychiatry, resources provided. Medication: -Olanzapine 5 mg twice daily Follow-up  with substance use treatment resources provided. Follow-up with housing resources provided.  Disposition: Discharge  Lenard Lance, FNP 07/31/2022, 3:51 PM

## 2022-08-04 ENCOUNTER — Other Ambulatory Visit (HOSPITAL_COMMUNITY): Payer: Self-pay

## 2023-01-17 DIAGNOSIS — J9600 Acute respiratory failure, unspecified whether with hypoxia or hypercapnia: Secondary | ICD-10-CM

## 2023-01-17 DIAGNOSIS — R4182 Altered mental status, unspecified: Principal | ICD-10-CM

## 2023-01-17 DIAGNOSIS — J690 Pneumonitis due to inhalation of food and vomit: Secondary | ICD-10-CM

## 2023-01-17 DIAGNOSIS — G9340 Encephalopathy, unspecified: Secondary | ICD-10-CM

## 2023-01-17 DIAGNOSIS — E872 Acidosis, unspecified: Secondary | ICD-10-CM

## 2023-01-17 DIAGNOSIS — J9601 Acute respiratory failure with hypoxia: Secondary | ICD-10-CM | POA: Diagnosis present

## 2023-01-17 LAB — BASIC METABOLIC PANEL
Anion gap: 17 — ABNORMAL HIGH (ref 5–15)
BUN: 9 mg/dL (ref 6–20)
CO2: 17 mmol/L — ABNORMAL LOW (ref 22–32)
Calcium: 7.9 mg/dL — ABNORMAL LOW (ref 8.9–10.3)
Chloride: 106 mmol/L (ref 98–111)
Creatinine, Ser: 0.71 mg/dL (ref 0.61–1.24)
GFR, Estimated: >60 mL/min (ref >60)

## 2023-01-17 LAB — I-STAT ARTERIAL BLOOD GAS, ED
Bicarbonate: 14.8 mmol/L — ABNORMAL LOW (ref 20.0–28.0)
Potassium: 4.3 mmol/L (ref 3.5–5.1)
Sodium: 140 mmol/L (ref 135–145)
TCO2: 16 mmol/L — ABNORMAL LOW (ref 22–32)

## 2023-01-17 LAB — MRSA NEXT GEN BY PCR, NASAL: MRSA by PCR Next Gen: NOT DETECTED

## 2023-01-17 LAB — CK: Total CK: 8244 U/L — ABNORMAL HIGH (ref 49–397)

## 2023-01-18 LAB — POCT I-STAT 7, (LYTES, BLD GAS, ICA,H+H)
Hemoglobin: 12.9 g/dL — ABNORMAL LOW (ref 13.0–17.0)
Patient temperature: 37.8
Potassium: 3.5 mmol/L (ref 3.5–5.1)

## 2023-01-18 LAB — GLUCOSE, CAPILLARY
Glucose-Capillary: 102 mg/dL — ABNORMAL HIGH (ref 70–99)
Glucose-Capillary: 113 mg/dL — ABNORMAL HIGH (ref 70–99)
Glucose-Capillary: 121 mg/dL — ABNORMAL HIGH (ref 70–99)

## 2023-01-18 LAB — BASIC METABOLIC PANEL
Anion gap: 15 (ref 5–15)
BUN: 7 mg/dL (ref 6–20)
Creatinine, Ser: 0.78 mg/dL (ref 0.61–1.24)
Sodium: 138 mmol/L (ref 135–145)

## 2023-01-18 LAB — CBC
HCT: 38.1 % — ABNORMAL LOW (ref 39.0–52.0)
MCH: 30.9 pg (ref 26.0–34.0)
MCHC: 35.7 g/dL (ref 30.0–36.0)
MCV: 86.6 fL (ref 80.0–100.0)
Platelets: 230 10*3/uL (ref 150–400)
RBC: 4.4 MIL/uL (ref 4.22–5.81)
RDW: 12.2 % (ref 11.5–15.5)
WBC: 10.5 10*3/uL (ref 4.0–10.5)
nRBC: 0 % (ref 0.0–0.2)

## 2023-01-18 LAB — LACTIC ACID, PLASMA: Lactic Acid, Venous: 4.5 mmol/L (ref 0.5–1.9)

## 2024-02-18 ENCOUNTER — Other Ambulatory Visit: Payer: Self-pay

## 2024-02-18 ENCOUNTER — Ambulatory Visit (HOSPITAL_COMMUNITY)
Admission: EM | Admit: 2024-02-18 | Discharge: 2024-02-19 | Disposition: A | Discharge status: Other Acute Inpatient Hospital | Payer: MEDICAID | Attending: Psychiatry | Admitting: Psychiatry

## 2024-02-18 DIAGNOSIS — F17200 Nicotine dependence, unspecified, uncomplicated: Secondary | ICD-10-CM | POA: Insufficient documentation

## 2024-02-18 DIAGNOSIS — F10150 Alcohol abuse with alcohol-induced psychotic disorder with delusions: Secondary | ICD-10-CM | POA: Diagnosis not present

## 2024-02-18 DIAGNOSIS — Z5900 Homelessness unspecified: Secondary | ICD-10-CM | POA: Diagnosis not present

## 2024-02-18 DIAGNOSIS — R4588 Nonsuicidal self-harm: Secondary | ICD-10-CM | POA: Diagnosis not present

## 2024-02-18 DIAGNOSIS — F19959 Other psychoactive substance use, unspecified with psychoactive substance-induced psychotic disorder, unspecified: Secondary | ICD-10-CM

## 2024-02-18 DIAGNOSIS — F1510 Other stimulant abuse, uncomplicated: Secondary | ICD-10-CM | POA: Insufficient documentation

## 2024-02-18 DIAGNOSIS — F1010 Alcohol abuse, uncomplicated: Secondary | ICD-10-CM

## 2024-02-18 LAB — CBC WITH DIFFERENTIAL/PLATELET
Abs Immature Granulocytes: 0.02 10*3/uL (ref 0.00–0.07)
Basophils Absolute: 0 10*3/uL (ref 0.0–0.1)
Basophils Relative: 0 %
Eosinophils Absolute: 0 10*3/uL (ref 0.0–0.5)
Eosinophils Relative: 0 %
HCT: 47.5 % (ref 39.0–52.0)
Hemoglobin: 16.3 g/dL (ref 13.0–17.0)
Immature Granulocytes: 0 %
Lymphocytes Relative: 27 %
Lymphs Abs: 2.1 10*3/uL (ref 0.7–4.0)
MCH: 30.6 pg (ref 26.0–34.0)
MCHC: 34.3 g/dL (ref 30.0–36.0)
MCV: 89.1 fL (ref 80.0–100.0)
Monocytes Absolute: 0.5 10*3/uL (ref 0.1–1.0)
Monocytes Relative: 7 %
Neutro Abs: 5 10*3/uL (ref 1.7–7.7)
Neutrophils Relative %: 66 %
Platelets: 136 10*3/uL — ABNORMAL LOW (ref 150–400)
RBC: 5.33 MIL/uL (ref 4.22–5.81)
RDW: 12.7 % (ref 11.5–15.5)
WBC: 7.7 10*3/uL (ref 4.0–10.5)
nRBC: 0 % (ref 0.0–0.2)

## 2024-02-18 LAB — POCT URINE DRUG SCREEN - MANUAL ENTRY (I-SCREEN)
POC Amphetamine UR: NOT DETECTED
POC Buprenorphine (BUP): NOT DETECTED
POC Cocaine UR: NOT DETECTED
POC Marijuana UR: NOT DETECTED
POC Methadone UR: NOT DETECTED
POC Methamphetamine UR: NOT DETECTED
POC Morphine: NOT DETECTED
POC Oxazepam (BZO): NOT DETECTED
POC Oxycodone UR: NOT DETECTED
POC Secobarbital (BAR): NOT DETECTED

## 2024-02-18 LAB — LIPID PANEL
Cholesterol: 132 mg/dL (ref 0–200)
HDL: 35 mg/dL — ABNORMAL LOW (ref >40)
LDL Cholesterol: 72 mg/dL (ref 0–99)
Total CHOL/HDL Ratio: 3.8 ratio
Triglycerides: 126 mg/dL (ref <150)
VLDL: 25 mg/dL (ref 0–40)

## 2024-02-18 LAB — COMPREHENSIVE METABOLIC PANEL WITH GFR
ALT: 13 U/L (ref 0–44)
AST: 18 U/L (ref 15–41)
Albumin: 4.9 g/dL (ref 3.5–5.0)
Alkaline Phosphatase: 61 U/L (ref 38–126)
Anion gap: 13 (ref 5–15)
BUN: 6 mg/dL (ref 6–20)
CO2: 23 mmol/L (ref 22–32)
Calcium: 9.9 mg/dL (ref 8.9–10.3)
Chloride: 108 mmol/L (ref 98–111)
Creatinine, Ser: 1.25 mg/dL — ABNORMAL HIGH (ref 0.61–1.24)
GFR, Estimated: >60 mL/min (ref >60)
Glucose, Bld: 80 mg/dL (ref 70–99)
Potassium: 3.8 mmol/L (ref 3.5–5.1)
Sodium: 144 mmol/L (ref 135–145)
Total Bilirubin: 1.1 mg/dL (ref 0.0–1.2)
Total Protein: 8 g/dL (ref 6.5–8.1)

## 2024-02-18 LAB — HEMOGLOBIN A1C
Hgb A1c MFr Bld: 4.2 % — ABNORMAL LOW (ref 4.8–5.6)
Mean Plasma Glucose: 73.84 mg/dL

## 2024-02-18 LAB — TSH: TSH: 0.814 u[IU]/mL (ref 0.350–4.500)

## 2024-02-18 LAB — ETHANOL: Alcohol, Ethyl (B): 129 mg/dL — ABNORMAL HIGH (ref <15)

## 2024-02-18 MED ORDER — LORAZEPAM 2 MG/ML IJ SOLN: 2.0000 mg | Freq: Three times a day (TID) | INTRAMUSCULAR | Status: DC | PRN

## 2024-02-18 MED ORDER — HALOPERIDOL LACTATE 5 MG/ML IJ SOLN: 10.0000 mg | Freq: Three times a day (TID) | INTRAMUSCULAR | Status: DC | PRN

## 2024-02-18 MED ORDER — DIPHENHYDRAMINE HCL 50 MG/ML IJ SOLN: 50.0000 mg | Freq: Three times a day (TID) | INTRAMUSCULAR | Status: DC | PRN

## 2024-02-18 MED ORDER — MAGNESIUM HYDROXIDE 400 MG/5ML PO SUSP: 30.0000 mL | Freq: Every day | ORAL | Status: DC | PRN

## 2024-02-18 MED ORDER — TRAZODONE HCL 50 MG PO TABS: 50.0000 mg | ORAL_TABLET | Freq: Every evening | ORAL | Status: DC | PRN

## 2024-02-18 MED ORDER — HYDROXYZINE HCL 25 MG PO TABS: 25.0000 mg | ORAL_TABLET | Freq: Three times a day (TID) | ORAL | Status: DC | PRN

## 2024-02-18 MED ORDER — HALOPERIDOL 5 MG PO TABS: 5.0000 mg | ORAL_TABLET | Freq: Three times a day (TID) | ORAL | Status: DC | PRN

## 2024-02-18 MED ORDER — ACETAMINOPHEN 325 MG PO TABS: 650.0000 mg | ORAL_TABLET | Freq: Four times a day (QID) | ORAL | Status: DC | PRN

## 2024-02-18 MED ORDER — DIPHENHYDRAMINE HCL 50 MG PO CAPS: 50.0000 mg | ORAL_CAPSULE | Freq: Three times a day (TID) | ORAL | Status: DC | PRN

## 2024-02-18 MED ORDER — HALOPERIDOL LACTATE 5 MG/ML IJ SOLN: 5.0000 mg | Freq: Three times a day (TID) | INTRAMUSCULAR | Status: DC | PRN

## 2024-02-18 MED ORDER — ALUM & MAG HYDROXIDE-SIMETH 200-200-20 MG/5ML PO SUSP: 30.0000 mL | ORAL | Status: DC | PRN
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
## 2024-02-18 NOTE — ED Notes (Signed)
 Patient A&Ox4. Patient denies SI/HI and AVH. Patient oriented to unit. Meal and beverage provided. Patient denies any physical complaints when asked. No acute distress noted. Support and encouragement provided. Routine safety checks conducted according to facility protocol. Encouraged patient to notify staff if thoughts of harm toward self or others arise. Patient verbalize understanding and agreement. Will continue to monitor for safety.

## 2024-02-18 NOTE — ED Notes (Signed)
 Patient resting quietly in bed with eyes closed. Respirations equal and unlabored, skin warm and dry, NAD. Routine safety checks conducted according to facility protocol. Will continue to monitor for safety.

## 2024-02-18 NOTE — ED Provider Notes (Signed)
 [facility] Urgent Care Continuous Assessment Admission H&P  Date: 02/19/24 Patient Name: Jim Anderson MRN: [id number] Chief Complaint: alcohol and methamphetamine abuse  Diagnoses:  Final diagnoses:  Alcohol abuse  Psychoactive substance-induced psychosis (HCC)    HPI: Jim Anderson is a 28 year old male with a history of psychosis and substance abuse. He was brought to [facility] voluntarily by Patent examiner for a mental health evaluation due to bizarre behavior. Per chart, police were called due to patient "screaming at traffic in front of the [employer] on [facility]  Patient was evaluated face to face and his chart was reviewed by this NP. On assessment, he reports daily use of alcohol and methamphetamine, either smoked or intravenously. He describes experiencing paranoia, stating that he sees "black uniforms" and hears commanding voices, however, he is unable to clarify what they instruct him to do. He believes that FBI and DEA agents are communicating with him through a chip implanted in his right wrist. He also reports visual hallucinations of "individuals" waving at him. Though he denies suicidal or homicidal ideation, he admits to punching himself in the face without knowing why. He is noted with swelling to his upper lip consistent with the self-inflicted injury.  patient is alert and oriented to self. He is disheveled and disorganized, with tangential speech that frequently shifts topics, making the assessment difficult to follow. He is noted to be responding to internal stimuli, frequently speaking into his wrist and holding it to his ear as if listening for commands. His thought content is notable for paranoid delusions about government officials and implanted devices. His mood appears anxious and affect is congruent with mood. His insight is poor and judgement is impaired. Thought process is disorganized  Total Time spent with patient: 30 minutes  Musculoskeletal  Strength & Muscle Tone: within  normal limits Gait & Station: normal Patient leans: Right  Psychiatric Specialty Exam  Presentation General Appearance:  Disheveled  Eye Contact: Good  Speech: Normal Rate  Speech Volume: Normal  Handedness: Right   Mood and Affect  Mood: Anxious  Affect: Congruent   Thought Process  Thought Processes: Disorganized  Descriptions of Associations:Tangential  Orientation:Partial  Thought Content:Paranoid Ideation  Diagnosis of Schizophrenia or Schizoaffective disorder in past: No  Duration of Psychotic Symptoms: Greater than six months  Hallucinations:Hallucinations: Auditory; Visual Description of Auditory Hallucinations: FBI and DEA agents giving him direction through a chip in his right wrist Description of Visual Hallucinations: "individuals waving at him"  Ideas of Reference:Paranoia  Suicidal Thoughts:Suicidal Thoughts: No  Homicidal Thoughts:Homicidal Thoughts: No   Sensorium  Memory: Immediate Poor; Recent Poor; Remote Poor  Judgment: Impaired  Insight: Lacking   Executive Functions  Concentration: Poor  Attention Span: Poor  Recall: Poor  Fund of Knowledge: Fair  Language: Fair   Psychomotor Activity  Psychomotor Activity: Psychomotor Activity: Normal   Assets  Assets: Desire for Improvement; Physical Health   Sleep  Sleep: Sleep: Fair Number of Hours of Sleep: 5   Nutritional Assessment (For OBS and FBC admissions only) Has the patient had a weight loss or gain of 10 pounds or more in the last 3 months?: No Has the patient had a decrease in food intake/or appetite?: No Does the patient have dental problems?: No Does the patient have eating habits or behaviors that may be indicators of an eating disorder including binging or inducing vomiting?: No Has the patient recently lost weight without trying?: 0 Has the patient been eating poorly because of a decreased appetite?: 0 Malnutrition  Screening Tool Score:  0    Physical Exam Vitals and nursing note reviewed.  Constitutional:      General: He is not in acute distress.    Appearance: He is well-developed. He is not ill-appearing.  HENT:     Head: Normocephalic.  Eyes:     Conjunctiva/sclera: Conjunctivae normal.  Cardiovascular:     Rate and Rhythm: Normal rate.  Pulmonary:     Effort: Pulmonary effort is normal.  Musculoskeletal:        General: Normal range of motion.     Cervical back: Normal range of motion.  Neurological:     Mental Status: He is alert and oriented to person, place, and time.  Psychiatric:        Attention and Perception: He is inattentive. He perceives auditory and visual hallucinations.        Mood and Affect: Mood is anxious.        Speech: Speech normal.        Behavior: Behavior is cooperative.        Thought Content: Thought content is paranoid and delusional.        Cognition and Memory: Cognition is impaired. Memory is impaired.    Review of Systems  Constitutional: Negative.   HENT: Negative.    Eyes: Negative.   Respiratory: Negative.    Cardiovascular: Negative.   Gastrointestinal: Negative.   Genitourinary: Negative.   Musculoskeletal: Negative.   Skin: Negative.   Neurological: Negative.   Endo/Heme/Allergies: Negative.   Psychiatric/Behavioral:  Positive for hallucinations and substance abuse. The patient is nervous/anxious.     Blood pressure 111/72, pulse 87, temperature 98 F (36.7 C), resp. rate 18, SpO2 100%. There is no height or weight on file to calculate BMI.  Past Psychiatric History:  alcohol and methamphetamine abuse  Is the patient at risk to self? Yes  Has the patient been a risk to self in the past 6 months? No .    Has the patient been a risk to self within the distant past? Yes   Is the patient a risk to others? No   Has the patient been a risk to others in the past 6 months? No   Has the patient been a risk to others within the distant past? No   Past Medical  History:  Past Medical History:  Diagnosis Date   Seizures (HCC)      Family History: None reported    Social History:  Social History   Tobacco Use   Smoking status: Every Day  Substance Use Topics   Alcohol use: Yes   Drug use: Yes    Types: Methamphetamines, Marijuana     Last Labs:  Admission on 02/18/2024  Component Date Value Ref Range Status   WBC 02/18/2024 7.7  4.0 - 10.5 K/uL Final   RBC 02/18/2024 5.33  4.22 - 5.81 MIL/uL Final   Hemoglobin 02/18/2024 16.3  13.0 - 17.0 g/dL Final   HCT 65/78/4696 47.5  39.0 - 52.0 % Final   MCV 02/18/2024 89.1  80.0 - 100.0 fL Final   MCH 02/18/2024 30.6  26.0 - 34.0 pg Final   MCHC 02/18/2024 34.3  30.0 - 36.0 g/dL Final   RDW 29/52/8413 12.7  11.5 - 15.5 % Final   Platelets 02/18/2024 136 (L)  150 - 400 K/uL Final   nRBC 02/18/2024 0.0  0.0 - 0.2 % Final   Neutrophils Relative % 02/18/2024 66  % Final   Neutro Abs 02/18/2024  5.0  1.7 - 7.7 K/uL Final   Lymphocytes Relative 02/18/2024 27  % Final   Lymphs Abs 02/18/2024 2.1  0.7 - 4.0 K/uL Final   Monocytes Relative 02/18/2024 7  % Final   Monocytes Absolute 02/18/2024 0.5  0.1 - 1.0 K/uL Final   Eosinophils Relative 02/18/2024 0  % Final   Eosinophils Absolute 02/18/2024 0.0  0.0 - 0.5 K/uL Final   Basophils Relative 02/18/2024 0  % Final   Basophils Absolute 02/18/2024 0.0  0.0 - 0.1 K/uL Final   Immature Granulocytes 02/18/2024 0  % Final   Abs Immature Granulocytes 02/18/2024 0.02  0.00 - 0.07 K/uL Final   Performed at [facility], [address], [place], [place] [address]   Sodium 02/18/2024 144  135 - 145 mmol/L Final   Potassium 02/18/2024 3.8  3.5 - 5.1 mmol/L Final   Chloride 02/18/2024 108  98 - 111 mmol/L Final   CO2 02/18/2024 23  22 - 32 mmol/L Final   Glucose, Bld 02/18/2024 80  70 - 99 mg/dL Final   Glucose reference range applies only to samples taken after fasting for at least 8 hours.   BUN 02/18/2024 6  6 - 20 mg/dL Final   Creatinine, Ser  02/18/2024 1.25 (H)  0.61 - 1.24 mg/dL Final   Calcium  02/18/2024 9.9  8.9 - 10.3 mg/dL Final   Total Protein 60/45/4098 8.0  6.5 - 8.1 g/dL Final   Albumin 11/91/4782 4.9  3.5 - 5.0 g/dL Final   AST 95/62/1308 18  15 - 41 U/L Final   ALT 02/18/2024 13  0 - 44 U/L Final   Alkaline Phosphatase 02/18/2024 61  38 - 126 U/L Final   Total Bilirubin 02/18/2024 1.1  0.0 - 1.2 mg/dL Final   GFR, Estimated 02/18/2024 >60  >60 mL/min Final   Comment: (NOTE) Calculated using the CKD-EPI Creatinine Equation (2021)    Anion gap 02/18/2024 13  5 - 15 Final   Performed at [facility], [address], [place], [place] [address]   Hgb A1c MFr Bld 02/18/2024 4.2 (L)  4.8 - 5.6 % Final   Comment: (NOTE) Pre diabetes:          5.7%-6.4%  Diabetes:              >6.4%  Glycemic control for   <7.0% adults with diabetes    Mean Plasma Glucose 02/18/2024 73.84  mg/dL Final   Performed at [facility], [address] [place], [place] [address]   Alcohol, Ethyl (B) 02/18/2024 129 (H)  <15 mg/dL Final   Comment: (NOTE) For medical purposes only. Performed at [facility], [address], [place], [place] [address]    Cholesterol 02/18/2024 132  0 - 200 mg/dL Final   Triglycerides 32/44/0102 126  <150 mg/dL Final   HDL 72/53/6644 35 (L)  >40 mg/dL Final   Total CHOL/HDL Ratio 02/18/2024 3.8  RATIO Final   VLDL 02/18/2024 25  0 - 40 mg/dL Final   LDL Cholesterol 02/18/2024 72  0 - 99 mg/dL Final   Comment:        Total Cholesterol/HDL:CHD Risk Coronary Heart Disease Risk Table                     Men   Women  1/2 Average Risk   3.4   3.3  Average Risk       5.0   4.4  2 X Average Risk   9.6   7.1  3 X Average Risk  23.4   11.0        Use the calculated Patient Ratio above and the CHD Risk Table to determine the patient's CHD Risk.        ATP III CLASSIFICATION (LDL):  <100     mg/dL   Optimal  213-086  mg/dL   Near or Above                    Optimal  130-159  mg/dL    Borderline  578-469  mg/dL   High  >629     mg/dL   Very High Performed at [facility], [address] [place], [place] [address]    POC Amphetamine UR 02/18/2024 None Detected  NONE DETECTED (Cut Off Level 1000 ng/mL) Final   POC Secobarbital (BAR) 02/18/2024 None Detected  NONE DETECTED (Cut Off Level 300 ng/mL) Final   POC Buprenorphine (BUP) 02/18/2024 None Detected  NONE DETECTED (Cut Off Level 10 ng/mL) Final   POC Oxazepam (BZO) 02/18/2024 None Detected  NONE DETECTED (Cut Off Level 300 ng/mL) Final   POC Cocaine UR 02/18/2024 None Detected  NONE DETECTED (Cut Off Level 300 ng/mL) Final   POC Methamphetamine UR 02/18/2024 None Detected  NONE DETECTED (Cut Off Level 1000 ng/mL) Final   POC Morphine 02/18/2024 None Detected  NONE DETECTED (Cut Off Level 300 ng/mL) Final   POC Methadone UR 02/18/2024 None Detected  NONE DETECTED (Cut Off Level 300 ng/mL) Final   POC Oxycodone UR 02/18/2024 None Detected  NONE DETECTED (Cut Off Level 100 ng/mL) Final   POC Marijuana UR 02/18/2024 None Detected  NONE DETECTED (Cut Off Level 50 ng/mL) Final   TSH 02/18/2024 0.814  0.350 - 4.500 uIU/mL Final   Comment: Performed by a 3rd Generation assay with a functional sensitivity of <=0.01 uIU/mL. Performed at [facility], [address] [place],  [address]     Allergies: Patient has no active allergies.  Medications:  Facility Ordered Medications  Medication   acetaminophen  (TYLENOL ) tablet 650 mg   alum & mag hydroxide-simeth (MAALOX/MYLANTA) 200-200-20 MG/5ML suspension 30 mL   magnesium  hydroxide (MILK OF MAGNESIA) suspension 30 mL   haloperidol (HALDOL) tablet 5 mg   And   diphenhydrAMINE  (BENADRYL ) capsule 50 mg   haloperidol lactate (HALDOL) injection 5 mg   And   diphenhydrAMINE  (BENADRYL ) injection 50 mg   And   LORazepam  (ATIVAN ) injection 2 mg   haloperidol lactate (HALDOL) injection 10 mg   And   diphenhydrAMINE  (BENADRYL ) injection 50 mg   And    LORazepam  (ATIVAN ) injection 2 mg   hydrOXYzine  (ATARAX ) tablet 25 mg   traZODone  (DESYREL ) tablet 50 mg   thiamine  (VITAMIN B1) injection 100 mg   [START ON 02/20/2024] thiamine  (VITAMIN B1) tablet 100 mg   multivitamin with minerals tablet 1 tablet   LORazepam  (ATIVAN ) tablet 1 mg   hydrOXYzine  (ATARAX ) tablet 25 mg   loperamide (IMODIUM) capsule 2-4 mg   ondansetron (ZOFRAN-ODT) disintegrating tablet 4 mg   OLANZapine  zydis (ZYPREXA ) disintegrating tablet 5 mg   PTA Medications  Medication Sig   OLANZapine  zydis (ZYPREXA ) 5 MG disintegrating tablet Dissolve 1 tablet (5 mg total) by mouth 2 (two) times daily. (Patient not taking: Reported on 01/18/2023)      Medical Decision Making  Patient is recommended for inpatient admission for stabilization.  Lab Orders  CBC with Differential/Platelet         Comprehensive metabolic panel         Hemoglobin A1c         Ethanol         Lipid panel         TSH         POCT Urine Drug Screen - (I-Screen)     Initiate CIWA protocol -lorazepam  1 mg every 6 hours prn for CIWA >10 -thiamine  100 mg daily for nutritional supplementation -hydroxyzine  25 mg every 6 hours prn for anxiety, CIWA < or = 10 -ondansetron 4 mg ODT every 6 hours prn nausea/vomiting -loperamide 2-4 mg capsule prn diarrhea or loose stools  -Resume Zyprexa  5mg /BID for psychosis     Recommendations  Based on my evaluation the patient does not appear to have an emergency medical condition.  Doreatha Gamer, NP 02/19/24  3:42 AM

## 2024-02-18 NOTE — Progress Notes (Signed)
 Patient has been denied by [facility] due to no appropriate beds available. Patient meets BH inpatient criteria per Fain Home, NP. Patient has been faxed out to the following facilities:   [facility] [address], [place] [address] [phone]  [facility] [address] [place] [address] [phone]  [facility] [address] [place] [address] [phone]  [facility] [address], [place] [address] [phone]  [facility] [address] [place], [place] [address] [phone]  [facility] Patient Placement [facility], [place] [phone]  [facility] [address], [place] [address] [phone]  [facility] EFAX [address] [place] [phone]  [facility] [address] [place] [address] [phone]  [facility] [address] [place] [address] [phone]  [facility] [address] [place], [place] [address] [phone]  [facility] [address] [place] [address] [phone]  [facility] [address], [place] [address] [phone]  [facility] [address] [place] [address] [phone]  [facility] [address] [place] [address] [phone]  [facility] [address] [place] [address] [phone]   Phares Brasher, MSW, LCSW-A  11:32 PM 02/18/2024
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
## 2024-02-19 DIAGNOSIS — R569 Unspecified convulsions: Secondary | ICD-10-CM | POA: Insufficient documentation

## 2024-02-19 MED ORDER — THIAMINE MONONITRATE 100 MG PO TABS: 100.0000 mg | ORAL_TABLET | Freq: Every day | ORAL | Status: DC

## 2024-02-19 MED ORDER — HYDROXYZINE HCL 25 MG PO TABS: 25.0000 mg | ORAL_TABLET | Freq: Four times a day (QID) | ORAL | Status: DC | PRN

## 2024-02-19 MED ORDER — NICOTINE POLACRILEX 2 MG MT GUM: 2.0000 mg | CHEWING_GUM | OROMUCOSAL | Status: DC | PRN

## 2024-02-19 MED ORDER — LORAZEPAM 1 MG PO TABS: 1.0000 mg | ORAL_TABLET | Freq: Four times a day (QID) | ORAL | Status: DC | PRN

## 2024-02-19 MED ORDER — ADULT MULTIVITAMIN W/MINERALS CH
1.0000 | ORAL_TABLET | Freq: Every day | ORAL | Status: DC
  Administered 2024-02-19: 1 via ORAL
  Filled 2024-02-19: qty 1

## 2024-02-19 MED ORDER — TRAZODONE HCL 50 MG PO TABS: 50.0000 mg | ORAL_TABLET | Freq: Every evening | ORAL | Status: DC | PRN

## 2024-02-19 MED ORDER — THIAMINE HCL 100 MG/ML IJ SOLN: 100.0000 mg | Freq: Once | INTRAMUSCULAR | Status: DC

## 2024-02-19 MED ORDER — LOPERAMIDE HCL 2 MG PO CAPS: 2.0000 mg | ORAL_CAPSULE | ORAL | Status: DC | PRN

## 2024-02-19 MED ORDER — ADULT MULTIVITAMIN W/MINERALS CH: 1.0000 | ORAL_TABLET | Freq: Every day | ORAL | Status: DC

## 2024-02-19 MED ORDER — VITAMIN B-1 100 MG PO TABS: 100.0000 mg | ORAL_TABLET | Freq: Every day | ORAL | Status: DC

## 2024-02-19 MED ORDER — OLANZAPINE 5 MG PO TBDP
5.0000 mg | ORAL_TABLET | Freq: Two times a day (BID) | ORAL | Status: DC
  Administered 2024-02-19: 5 mg via ORAL
  Filled 2024-02-19: qty 1

## 2024-02-19 MED ORDER — OLANZAPINE 5 MG PO TBDP
5.0000 mg | ORAL_TABLET | Freq: Two times a day (BID) | ORAL | 0 refills | Status: DC
  Filled 2024-02-19: qty 60, 30d supply, fill #0

## 2024-02-19 MED ORDER — ONDANSETRON 4 MG PO TBDP: 4.0000 mg | ORAL_TABLET | Freq: Four times a day (QID) | ORAL | Status: DC | PRN

## 2024-02-19 NOTE — ED Notes (Signed)
 Pt sleeping at this time. Rise and fall of chest noted. Pt in NAD at this time. Will continue to monitor.

## 2024-02-19 NOTE — ED Notes (Addendum)
 Pt ate breakfast and lunch. No complaints reported, no distress observed. RN spoke with [facility] rep, Pattie Borders, approximately 108pm. Pt currently resting in bed.

## 2024-02-19 NOTE — Progress Notes (Signed)
 BHH/BMU LCSW Progress Note   02/19/2024    1:16 PM  Jim Anderson   [id number]   Type of Contact and Topic:  Psychiatric Bed Placement   Pt accepted to [facility]     Patient meets inpatient criteria per Fain Home, NP  The attending provider will be Dr. Lavona Pounds  Call report to [phone]  Nyle Belling, RN @ [facility] notified.     Pt scheduled  to arrive at [facility]    Phares Brasher, MSW, LCSW-A  1:17 PM 02/19/2024

## 2024-02-19 NOTE — ED Notes (Addendum)
 Pt administered AM medications- multivitamin. Pt affirms to have eaten breakfast- cereal. RN encouraged adequate hydration. Pt denies complaints at this time, however, did express concern about homelessness. Pt denies si hi avh, verbally agreed to notify staff if feelings/ symptoms change. Pt currently resting in bed at this time.

## 2024-02-19 NOTE — ED Notes (Signed)
 Patient resting quietly in bed with eyes closed. Respirations equal and unlabored, skin warm and dry, NAD. Routine safety checks conducted according to facility protocol. Will continue to monitor for safety.

## 2024-02-19 NOTE — ED Notes (Signed)
 Patient Vol transferred to [facility] via safe transport. Pt A&Ox4, ambulatory w/ steady gait and VSS upon departure. All belongings and paperwork given to safe transport.

## 2024-02-19 NOTE — ED Notes (Signed)
 Pt asleep in bed, no distress reported or observed at this time.

## 2024-02-19 NOTE — Progress Notes (Signed)
 Patient has been denied by [facility] due to no appropriate beds available. Patient meets BH inpatient criteria per Fain Home, NP. Patient has been faxed out to the following facilities:   [facility] [address] [place] [address] [phone]  [facility] [address] Johnella Naas [place] [address] [phone]  [facility] [address] [place] [address] [phone]  [facility] [address] [place] [address] [phone]  [facility] [address] [place], [place] [address] [phone]  [facility] Patient Placement [facility], [place] [phone]  [facility] [address], [place] [address] [phone]  [facility] EFA[facility] [address] [place] [phone]  [facility] [address] [place] [address] [phone]  [facility] [address] [place] [address] [phone]  [facility] [address] [place], [place] [address] [phone]  [facility] [address] [place] [address] [phone]  [facility] [address], [place] [address] [phone]  [facility] [address] [place] [address] [phone]  [facility] [address] [place] [address] [phone]  [facility] [address] [place] [address] [phone]   Phares Brasher, MSW, LCSW-A  12:08 PM 02/19/2024

## 2024-02-19 NOTE — ED Provider Notes (Signed)
 Behavioral Health Progress Note  Date and Time: 02/19/2024 12:01 PM Name: Jim Anderson MRN:  [id number]  Subjective:  Jim Anderson was seen at the bedside today on rounds. He feels that he is doing "okay", and is sleeping and eating regularly. He does not know where he is or what kind of place this is. He recalls being picked up by police and "I was mad yesterday". When asked to elaborate, he says "its something private". He appears internally distracted but denies hallucinations, though I think he may be minimizing out of paranoia. He denies current thoughts of harm to self or others. No new physical complaints. No medication side effect complaints  Diagnosis:  Final diagnoses:  Alcohol abuse  Psychoactive substance-induced psychosis (HCC)    Total Time spent with patient: 15 minutes  Past Psychiatric History: alcohol and methamphetamine abuse, psychosis Past Medical History: seizure history Family History: none reported Social History: homeless in [place]. Has legal history. Unemployed.   Additional Social History:    Pain Medications: See MAR Prescriptions: See MAR Over the Counter: See MAR History of alcohol / drug use?: Yes Longest period of sobriety (when/how long): Pt unable to recall Negative Consequences of Use: Legal, Personal relationships, Work / Programmer, multimedia, Surveyor, quantity Withdrawal Symptoms: Aggressive/Assaultive, Irritability                    Sleep: Good  Appetite:  Good  Current Medications:  Current Facility-Administered Medications  Medication Dose Route Frequency Provider Last Rate Last Admin   acetaminophen  (TYLENOL ) tablet 650 mg  650 mg Oral Q6H PRN Ajibola, Ene A, NP       alum & mag hydroxide-simeth (MAALOX/MYLANTA) 200-200-20 MG/5ML suspension 30 mL  30 mL Oral Q4H PRN Ajibola, Ene A, NP       haloperidol (HALDOL) tablet 5 mg  5 mg Oral TID PRN Ajibola, Ene A, NP       And   diphenhydrAMINE  (BENADRYL ) capsule 50 mg  50 mg Oral TID PRN Ajibola, Ene A,  NP       haloperidol lactate (HALDOL) injection 5 mg  5 mg Intramuscular TID PRN Ajibola, Ene A, NP       And   diphenhydrAMINE  (BENADRYL ) injection 50 mg  50 mg Intramuscular TID PRN Ajibola, Ene A, NP       And   LORazepam  (ATIVAN ) injection 2 mg  2 mg Intramuscular TID PRN Ajibola, Ene A, NP       haloperidol lactate (HALDOL) injection 10 mg  10 mg Intramuscular TID PRN Ajibola, Ene A, NP       And   diphenhydrAMINE  (BENADRYL ) injection 50 mg  50 mg Intramuscular TID PRN Ajibola, Ene A, NP       And   LORazepam  (ATIVAN ) injection 2 mg  2 mg Intramuscular TID PRN Ajibola, Ene A, NP       hydrOXYzine  (ATARAX ) tablet 25 mg  25 mg Oral Q6H PRN Ajibola, Ene A, NP       loperamide (IMODIUM) capsule 2-4 mg  2-4 mg Oral PRN Ajibola, Ene A, NP       LORazepam  (ATIVAN ) tablet 1 mg  1 mg Oral Q6H PRN Ajibola, Ene A, NP       magnesium  hydroxide (MILK OF MAGNESIA) suspension 30 mL  30 mL Oral Daily PRN Ajibola, Ene A, NP       multivitamin with minerals tablet 1 tablet  1 tablet Oral Daily Ajibola, Ene A, NP   1 tablet at 02/19/24 1050  nicotine polacrilex (NICORETTE) gum 2 mg  2 mg Oral PRN Chalise Pe, Alonza Arthurs, MD       OLANZapine  zydis (ZYPREXA ) disintegrating tablet 5 mg  5 mg Oral BID Ajibola, Ene A, NP   5 mg at 02/19/24 0456   ondansetron (ZOFRAN-ODT) disintegrating tablet 4 mg  4 mg Oral Q6H PRN Ajibola, Ene A, NP       thiamine  (VITAMIN B1) injection 100 mg  100 mg Intramuscular Once Ajibola, Ene A, NP       [START ON 02/20/2024] thiamine  (VITAMIN B1) tablet 100 mg  100 mg Oral Daily Ajibola, Ene A, NP       traZODone  (DESYREL ) tablet 50 mg  50 mg Oral QHS PRN Ajibola, Ene A, NP       Current Outpatient Medications  Medication Sig Dispense Refill   OLANZapine  zydis (ZYPREXA ) 5 MG disintegrating tablet Dissolve 1 tablet (5 mg total) by mouth 2 (two) times daily. (Patient not taking: Reported on 01/18/2023) 60 tablet 0    Labs  Lab Results:  Admission on 02/18/2024  Component Date Value  Ref Range Status   WBC 02/18/2024 7.7  4.0 - 10.5 K/uL Final   RBC 02/18/2024 5.33  4.22 - 5.81 MIL/uL Final   Hemoglobin 02/18/2024 16.3  13.0 - 17.0 g/dL Final   HCT 14/78/2956 47.5  39.0 - 52.0 % Final   MCV 02/18/2024 89.1  80.0 - 100.0 fL Final   MCH 02/18/2024 30.6  26.0 - 34.0 pg Final   MCHC 02/18/2024 34.3  30.0 - 36.0 g/dL Final   RDW 21/30/8657 12.7  11.5 - 15.5 % Final   Platelets 02/18/2024 136 (L)  150 - 400 K/uL Final   nRBC 02/18/2024 0.0  0.0 - 0.2 % Final   Neutrophils Relative % 02/18/2024 66  % Final   Neutro Abs 02/18/2024 5.0  1.7 - 7.7 K/uL Final   Lymphocytes Relative 02/18/2024 27  % Final   Lymphs Abs 02/18/2024 2.1  0.7 - 4.0 K/uL Final   Monocytes Relative 02/18/2024 7  % Final   Monocytes Absolute 02/18/2024 0.5  0.1 - 1.0 K/uL Final   Eosinophils Relative 02/18/2024 0  % Final   Eosinophils Absolute 02/18/2024 0.0  0.0 - 0.5 K/uL Final   Basophils Relative 02/18/2024 0  % Final   Basophils Absolute 02/18/2024 0.0  0.0 - 0.1 K/uL Final   Immature Granulocytes 02/18/2024 0  % Final   Abs Immature Granulocytes 02/18/2024 0.02  0.00 - 0.07 K/uL Final   Performed at [facility], [address] [place], [place] [address]   Sodium 02/18/2024 144  135 - 145 mmol/L Final   Potassium 02/18/2024 3.8  3.5 - 5.1 mmol/L Final   Chloride 02/18/2024 108  98 - 111 mmol/L Final   CO2 02/18/2024 23  22 - 32 mmol/L Final   Glucose, Bld 02/18/2024 80  70 - 99 mg/dL Final   Glucose reference range applies only to samples taken after fasting for at least 8 hours.   BUN 02/18/2024 6  6 - 20 mg/dL Final   Creatinine, Ser 02/18/2024 1.25 (H)  0.61 - 1.24 mg/dL Final   Calcium  02/18/2024 9.9  8.9 - 10.3 mg/dL Final   Total Protein 29/52/8413 8.0  6.5 - 8.1 g/dL Final   Albumin 24/40/1027 4.9  3.5 - 5.0 g/dL Final   AST 25/36/6440 18  15 - 41 U/L Final   ALT 02/18/2024 13  0 - 44 U/L Final  Alkaline Phosphatase 02/18/2024 61  38 - 126 U/L Final   Total Bilirubin  02/18/2024 1.1  0.0 - 1.2 mg/dL Final   GFR, Estimated 02/18/2024 >60  >60 mL/min Final   Comment: (NOTE) Calculated using the CKD-EPI Creatinine Equation (2021)    Anion gap 02/18/2024 13  5 - 15 Final   Performed at [facility], [address] [place], [place] [address]   Hgb A1c MFr Bld 02/18/2024 4.2 (L)  4.8 - 5.6 % Final   Comment: (NOTE) Pre diabetes:          5.7%-6.4%  Diabetes:              >6.4%  Glycemic control for   <7.0% adults with diabetes    Mean Plasma Glucose 02/18/2024 73.84  mg/dL Final   Performed at [facility], [address] [place], [place] [address]   Alcohol, Ethyl (B) 02/18/2024 129 (H)  <15 mg/dL Final   Comment: (NOTE) For medical purposes only. Performed at [facility], [address], [place], [place] [address]    Cholesterol 02/18/2024 132  0 - 200 mg/dL Final   Triglycerides 91/47/8295 126  <150 mg/dL Final   HDL 62/13/0865 35 (L)  >40 mg/dL Final   Total CHOL/HDL Ratio 02/18/2024 3.8  RATIO Final   VLDL 02/18/2024 25  0 - 40 mg/dL Final   LDL Cholesterol 02/18/2024 72  0 - 99 mg/dL Final   Comment:        Total Cholesterol/HDL:CHD Risk Coronary Heart Disease Risk Table                     Men   Women  1/2 Average Risk   3.4   3.3  Average Risk       5.0   4.4  2 X Average Risk   9.6   7.1  3 X Average Risk  23.4   11.0        Use the calculated Patient Ratio above and the CHD Risk Table to determine the patient's CHD Risk.        ATP III CLASSIFICATION (LDL):  <100     mg/dL   Optimal  784-696  mg/dL   Near or Above                    Optimal  130-159  mg/dL   Borderline  295-284  mg/dL   High  >132     mg/dL   Very High Performed at [facility], [address], [place], [place] [address]    POC Amphetamine UR 02/18/2024 None Detected  NONE DETECTED (Cut Off Level 1000 ng/mL) Final   POC Secobarbital (BAR) 02/18/2024 None Detected  NONE DETECTED (Cut Off Level 300 ng/mL) Final   POC Buprenorphine  (BUP) 02/18/2024 None Detected  NONE DETECTED (Cut Off Level 10 ng/mL) Final   POC Oxazepam (BZO) 02/18/2024 None Detected  NONE DETECTED (Cut Off Level 300 ng/mL) Final   POC Cocaine UR 02/18/2024 None Detected  NONE DETECTED (Cut Off Level 300 ng/mL) Final   POC Methamphetamine UR 02/18/2024 None Detected  NONE DETECTED (Cut Off Level 1000 ng/mL) Final   POC Morphine 02/18/2024 None Detected  NONE DETECTED (Cut Off Level 300 ng/mL) Final   POC Methadone UR 02/18/2024 None Detected  NONE DETECTED (Cut Off Level 300 ng/mL) Final   POC Oxycodone UR 02/18/2024 None Detected  NONE DETECTED (Cut Off Level  100 ng/mL) Final   POC Marijuana UR 02/18/2024 None Detected  NONE DETECTED (Cut Off Level 50 ng/mL) Final   TSH 02/18/2024 0.814  0.350 - 4.500 uIU/mL Final   Comment: Performed by a 3rd Generation assay with a functional sensitivity of <=0.01 uIU/mL. Performed at [facility], [address] [place], [place] [address]     Blood Alcohol level:  Lab Results  Component Value Date   ETH 129 (H) 02/18/2024   ETH <10 07/30/2022    Metabolic Disorder Labs: Lab Results  Component Value Date   HGBA1C 4.2 (L) 02/18/2024   MPG 73.84 02/18/2024   MPG 79.58 07/30/2022   Lab Results  Component Value Date   PROLACTIN 4.8 07/30/2022   Lab Results  Component Value Date   CHOL 132 02/18/2024   TRIG 126 02/18/2024   HDL 35 (L) 02/18/2024   CHOLHDL 3.8 02/18/2024   VLDL 25 02/18/2024   LDLCALC 72 02/18/2024   LDLCALC 51 07/30/2022    Therapeutic Lab Levels: No results found for: "LITHIUM" No results found for: "VALPROATE" No results found for: "CBMZ"  Physical Findings   PHQ2-9    Flowsheet Row ED from 07/30/2022 in [facility] Office Visit from 11/18/2020 in [facility]  PHQ-2 Total Score 4 5  PHQ-9 Total Score -- 24      Flowsheet Row ED from 02/18/2024 in [facility] ED from 07/30/2022 in  [facility] Office Visit from 11/18/2020 in [facility]  C-SSRS RISK CATEGORY No Risk High Risk Low Risk        Musculoskeletal  Strength & Muscle Tone: within normal limits Gait & Station: normal Patient leans: N/A  Psychiatric Specialty Exam  Presentation  General Appearance:  Disheveled  Eye Contact: Fair  Speech: Blocked  Speech Volume: Normal  Handedness: Right   Mood and Affect  Mood: Euthymic  Affect: Flat   Thought Process  Thought Processes: Coherent  Descriptions of Associations:Loose  Orientation:Partial  Thought Content:Paranoid Ideation  Diagnosis of Schizophrenia or Schizoaffective disorder in past: No  Duration of Psychotic Symptoms: Greater than six months   Hallucinations:Hallucinations: None Description of Auditory Hallucinations: FBI and DEA agents giving him direction through a chip in his right wrist Description of Visual Hallucinations: "individuals waving at him"  Ideas of Reference:Paranoia  Suicidal Thoughts:Suicidal Thoughts: No  Homicidal Thoughts:Homicidal Thoughts: No   Sensorium  Memory: Immediate Poor; Recent Poor; Remote Poor  Judgment: Impaired  Insight: Poor   Executive Functions  Concentration: Poor  Attention Span: Poor  Recall: Poor  Fund of Knowledge: Fair  Language: Fair   Psychomotor Activity  Psychomotor Activity: Psychomotor Activity: Decreased   Assets  Assets: Leisure Time   Sleep  Sleep: Sleep: Good Number of Hours of Sleep: 5   Nutritional Assessment (For OBS and [facility]BC admissions only) Has the patient had a weight loss or gain of 10 pounds or more in the last 3 months?: No Has the patient had a decrease in food intake/or appetite?: No Does the patient have dental problems?: No Does the patient have eating habits or behaviors that may be indicators of an eating disorder including binging or inducing vomiting?:  No Has the patient recently lost weight without trying?: 0 Has the patient been eating poorly because of a decreased appetite?: 0 Malnutrition Screening Tool Score: 0    Physical Exam  Physical Exam Vitals and nursing note reviewed.  Eyes:  Extraocular Movements: Extraocular movements intact.  Pulmonary:     Effort: Pulmonary effort is normal.  Musculoskeletal:        General: Normal range of motion.     Cervical back: Normal range of motion.  Neurological:     General: No focal deficit present.     Mental Status: He is alert. He is disoriented.  Psychiatric:        Speech: Speech is delayed.        Thought Content: Thought content is paranoid.        Cognition and Memory: He exhibits impaired recent memory and impaired remote memory.    Review of Systems  Constitutional:  Negative for chills and fever.  Gastrointestinal:  Negative for nausea and vomiting.  Musculoskeletal:  Negative for myalgias.  Neurological:  Negative for tremors.  Psychiatric/Behavioral:  Negative for suicidal ideas.    Blood pressure 110/64, pulse 79, temperature 97.8 F (36.6 C), temperature source Oral, resp. rate 18, SpO2 100%. There is no height or weight on file to calculate BMI.  Treatment Plan Summary: Long Term Goals: Improvement in symptoms so as ready for discharge   Short Term Goals: Patient will verbalize feelings in meetings with treatment team members., Patient will attend at least of 50% of the groups daily., Pt will complete the PHQ9 on admission, day 3 and discharge., and Patient will take medications as prescribed daily.  Medications: Mood/anxiety: continue group therapy, milieu therapy, 1:1 evaluation with provider.  Medication management: continue olanzapine  5mg  PO BID  Substance Abuse:  Alcohol use disorder: Replacing Thiamine . CIWA monitoring with benzodiazepine coverage per withdrawal scoring. Monitoring HR and BP.  Nicotine and tobacco use: Nicotine replacement therapy  provided.  Medical: PRNs for pain, constipation, indigestion available.  Labs/studies: no new labs Safety and Monitoring: voluntarily admission to [facility]/Observation unit for safety, stabilization and treatment. Will continue to seek inpatient bed as available.  Daily contact with patient to assess and evaluate symptoms and progress in treatment Patient's case to be discussed in multi-disciplinary team meeting Observation Level : q15 minute checks Vital signs: q12 hours Precautions: withdrawal and elopement  Based on my evaluation the patient does not appear to have an emergency medical condition.   Floyce Hutching, MD 02/19/2024 12:01 PM

## 2024-02-20 ENCOUNTER — Other Ambulatory Visit: Payer: Self-pay

## 2024-02-22 ENCOUNTER — Other Ambulatory Visit: Payer: Self-pay

## 2024-02-23 ENCOUNTER — Other Ambulatory Visit (HOSPITAL_COMMUNITY): Payer: Self-pay

## 2024-03-06 ENCOUNTER — Other Ambulatory Visit (HOSPITAL_COMMUNITY): Payer: Self-pay

## 2024-03-21 ENCOUNTER — Ambulatory Visit (INDEPENDENT_AMBULATORY_CARE_PROVIDER_SITE_OTHER): Payer: MEDICAID | Admitting: Student

## 2024-03-21 ENCOUNTER — Other Ambulatory Visit (HOSPITAL_COMMUNITY): Payer: Self-pay

## 2024-03-21 DIAGNOSIS — F1020 Alcohol dependence, uncomplicated: Secondary | ICD-10-CM | POA: Diagnosis not present

## 2024-03-21 DIAGNOSIS — F203 Undifferentiated schizophrenia: Secondary | ICD-10-CM

## 2024-03-21 DIAGNOSIS — F1121 Opioid dependence, in remission: Secondary | ICD-10-CM

## 2024-03-21 DIAGNOSIS — F1521 Other stimulant dependence, in remission: Secondary | ICD-10-CM

## 2024-03-21 DIAGNOSIS — F1221 Cannabis dependence, in remission: Secondary | ICD-10-CM

## 2024-03-21 DIAGNOSIS — F29 Unspecified psychosis not due to a substance or known physiological condition: Secondary | ICD-10-CM

## 2024-03-21 DIAGNOSIS — Z5900 Homelessness unspecified: Secondary | ICD-10-CM | POA: Diagnosis not present

## 2024-03-21 MED ORDER — RISPERIDONE 1 MG PO TABS
1.0000 mg | ORAL_TABLET | Freq: Every day | ORAL | 1 refills | Status: AC
  Filled 2024-03-21: qty 30, 30d supply, fill #0

## 2024-03-21 NOTE — Progress Notes (Unsigned)
 Psychiatric Initial Adult Assessment  Date: 03/21/2024, 8:19 AM  Patient Identification: Jim Anderson MRN: [id number] DOB: November 06, 1995  Referral Source: Patient, No Pcp Per ***  ASSESSMENT / PLAN  Jim Anderson is a 28 y.o. 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 Case manager: Jim?  I do not go good with employment Just enjoy life  Alcohol: 1 pint of vodka, 1-2 beers Meth: last time recorded daily Long time ago for weed and stim  A few months of sobriety for 1 year Denies withdrawal  Meth  Denies hx of depression/anxiety  Sober living?  Denies hx of mania.   Eat and sleep quite well.   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 There patient is deemed to be at chronic elevated risk for self-harm/suicide given the following factors: {SABSUICIDERISKFACTORS:29780}. These risk factors are mitigated by the following factors: {SABSUICIDEPROTECTIVEFACTORS:29779}. The patient is deemed to be at chronic elevated risk for violence given the following factors: {SABVIOLENCERISKFACTORS:29781}. These risk factors are mitigated by the following factors: {SABVIOLENCEPROTECTIVEFACTORS:29782}. There is no *** acute risk for suicide or violence at this time. The  patient was educated about relevant modifiable risk factors including following recommendations for treatment of psychiatric illness and abstaining from substance abuse.  While future psychiatric events cannot be accurately predicted, the patient does not *** currently require  acute inpatient psychiatric care and does not *** currently meet [place]  involuntary commitment criteria.    There are no diagnoses linked to this encounter.  Follow-up on: Visit date not found  Future Appointments  Date Time Provider Department Center  03/21/2024  8:30 AM Lynnette Barter, MD [facility] None     Patient was given contact information for behavioral health clinic and was instructed to call 911 for emergencies.     HISTORY OF PRESENT ILLNESS  Chief Complaint: No chief complaint on file.    ***  Patient amenable to *** after discussing the risks, benefits, and side effects. Otherwise patient had no other questions or concerns and was amenable to plan per above.  Patient's main concern: ***  Patient's goal: ***  Safety: ***. Patient contracted to safety, would call ***. Patient *** aware of [facility], 988 and 911 as well.  *** access to guns or weapons.  Current outpatient therapist: *** Current rx: ***  ROS   PSYCH ROS  Depression: *** depressed mood and pervasive sadness, anhedonia, insomnia***, hypersomnia***, guilt, decreased energy, decreased concentration, decreased*** or increased*** appetite, psychomotor slowing*** restlessness***, and suicidal ideation or intentions Duration of Depression Symptoms: Greater than two weeks  (Hypo-) Mania: *** excessive energy despite decreased need for sleep or persistent irritability (<2hr/night x4-7days), grandiosity/inflated self-esteem, sexual indiscretion, racing thoughts, pressured speech, distractibility/inattention. Anxiety: *** having difficulty controlling/managing anxiety/worry/stress and that it is out of proportion with stressors. ***  associated sxs of restlessness, being on edge, easily fatigued, concentration  difficulty, irritability, muscle tension, sleep disturbance.  Psychosis:  *** AVH, delusions, paranoia, first rank symptoms.  Trauma:  Trauma: *** life-threatening, physical, sexual, witnessed *** flashbacks, nightmares, hypervigilance/hyperarousal, avoidance.  Acute stress d/o 0-3d *** Adjustment d/o 3d-27mo PTSD >74mo  Eating: ***  *** intense fear of gaining weight, perception of being fat, restricting to lose weight. *** overeating in one sitting, unable to stop eating or losing track of time while eating, compensates by restricting, over-exercising, vomiting, laxative misuse, diuretic misuse.   Mild: 1 to 3 episodes of inappropriate compensatory behaviours per week. Moderate: 4 to 7 episodes  Severe: 8 to 13 episodes  Extreme: 14 or more episodes   S: Do you ever make yourself sick because you feel uncomfortably full? C: Do you worry you have lost control over how much you eat? O: Have you recently lost more than one stone [14 pounds/6.4kg] in a 3 month period? F: Do you believe yourself to be fat when others say you are too thin? F: Would you say that food dominates your life?   PAST HISTORY   Past Psychiatric History:  Hospitalizations: *** Suicide attempts: *** NSSIB: *** Psychotherapy: *** Dx: *** Rx: *** Head trauma: ***  Past Medical History: Dx:  has a past medical history of Seizures (HCC).  Head trauma: *** Seizures: *** Allergies: Patient has no known allergies.   Family Psychiatric History:  Suicide: *** Homicide: *** Hospitalization: *** BiPD: *** SCZ/SCzA: *** Others: ***  Social History:  Living with: *** Income: *** Marital Status: *** Children: *** Support: *** Guns/Weapons: *** Legal: *** DUI/DWI: *** Jail/prison: *** Developmental: ***  Substance Use History: EtOH:  reports current alcohol use.*** Nicotine :  reports that he has been smoking. He does not  have any smokeless tobacco history on file.*** THC/CBD: *** IV drug use: *** Stimulants: *** Opiates: *** Sedative/hypnotics: *** Hallucinogens: *** Seizures: *** DT: *** Detox: *** Residential: ***  Substance Abuse History in the last 12 months:  {yes no:314532}    Past Medical History:  Past Medical History:  Diagnosis Date  . Seizures (HCC)    No past surgical history on file.  Family History: No family history on file.  Social History:   Social History   Socioeconomic History  . Marital status: Married    Spouse name: Not on file  . Number of children: Not on file  . Years of education: Not on file  . Highest education level: Not on file  Occupational History  . Not on file  Tobacco Use  . Smoking status: Every Day  . Smokeless tobacco: Not on file  Substance and Sexual Activity  . Alcohol use: Yes  . Drug use: Yes    Types: Methamphetamines, Marijuana  . Sexual activity: Not on file  Other Topics Concern  . Not on file  Social History Narrative  . Not on file   Social Drivers of Health   Financial Resource Strain: Not on file  Food Insecurity: Food Insecurity Present (02/18/2024)   Hunger Vital Sign   . Worried About Programme researcher, broadcasting/film/video in the Last Year: Often true   . Ran Out of Food in the Last Year: Often true  Transportation Needs: Unmet Transportation Needs (02/18/2024)   PRAPARE - Transportation   . Lack of Transportation (Medical): Yes   . Lack of Transportation (Non-Medical): Yes  Physical Activity: Not on file  Stress: Not on file  Social Connections: Not on file    Allergies: No Known Allergies  Current  Medications: Current Outpatient Medications  Medication Sig Dispense Refill  . LORazepam  (ATIVAN ) 1 MG tablet Take 1 tablet (1 mg total) by mouth every 6 (six) hours as needed (CIWA > 10).    . Multiple Vitamin (MULTIVITAMIN WITH MINERALS) TABS tablet Take 1 tablet by mouth daily.    . OLANZapine  zydis (ZYPREXA ) 5 MG disintegrating  tablet Dissolve 1 tablet (5 mg total) by mouth 2 (two) times daily. 60 tablet 0  . thiamine  (VITAMIN B-1) 100 MG tablet Take 1 tablet (100 mg total) by mouth daily.    . traZODone  (DESYREL ) 50 MG tablet Take 1 tablet (50 mg total) by mouth at bedtime as needed for sleep.     No current facility-administered medications for this visit.        OBJECTIVE  There were no vitals taken for this visit.  Psychiatric Specialty Exam: General Appearance: Casual, faily groomed, not guarded ***  Eye Contact:  Good  Speech:  Clear, coherent, normal rate, non-pressured  Volume:  Normal  Mood:  ***  Affect:  Appropriate, congruent, full range   Thought Content: Logical, rumination. No command or non-command AVH, paranoid delusions, first rank sxs.   Suicidal Thoughts:  Denied active and passive SI  Homicidal Thoughts:  Denied active and passive HI  Thought Process:  Coherent, goal-directed, mostly linear, circumstantial at times ***  Orientation:  A&Ox4  Memory:  Immediate good  Judgement:  Fair  Insight:  Fair, shallow  Concentration:  Attention and concentration good ***  Recall:  [employer] of Knowledge:  Fair  Language:  Good, no aphasia  Psychomotor Activity:  Normal  Akathisia:  NA, no antipsychotics ***  AIMS (if indicated):  NA, no antipsychotics   ***  Assets:  {Assets (PAA):22698}  ADL's:  Intact  Cognition:  WNL  Sleep:  ***     Wt Readings from Last 3 Encounters:  11/14/20 148 lb (67.1 kg)  12/24/19 140 lb (63.5 kg)  03/10/15 130 lb (59 kg)   Temp Readings from Last 3 Encounters:  02/19/24 97.8 F (36.6 C)  07/31/22 98.3 F (36.8 C) (Oral)  11/14/20 98.2 F (36.8 C) (Oral)   BP Readings from Last 3 Encounters:  02/19/24 110/64  07/31/22 96/66  11/14/20 123/78   Pulse Readings from Last 3 Encounters:  02/19/24 79  07/31/22 86  11/14/20 (!) 108     Physical Exam  Strength & Muscle Tone: {desc; muscle tone:32375} Gait & Station: {PE GAIT ED  WJUO:77474}  Screenings:  PHQ2-9    Flowsheet Row ED from 07/30/2022 in [facility] Office Visit from 11/18/2020 in [facility]  PHQ-2 Total Score 4 5  PHQ-9 Total Score -- 24   Flowsheet Row ED from 02/18/2024 in [facility] ED from 07/30/2022 in [facility] Office Visit from 11/18/2020 in [facility]  C-SSRS RISK CATEGORY No Risk High Risk Low Risk    Collaboration of Care: Case discussed with current outpatient attending, see attending's attestation for additional information   Signed: Prentice Espy, MD

## 2024-03-22 ENCOUNTER — Other Ambulatory Visit (HOSPITAL_COMMUNITY): Payer: Self-pay

## 2024-03-22 ENCOUNTER — Encounter (HOSPITAL_COMMUNITY): Payer: Self-pay | Admitting: Student

## 2024-03-22 DIAGNOSIS — F1020 Alcohol dependence, uncomplicated: Secondary | ICD-10-CM | POA: Insufficient documentation

## 2024-03-22 DIAGNOSIS — F1521 Other stimulant dependence, in remission: Secondary | ICD-10-CM | POA: Insufficient documentation

## 2024-03-22 DIAGNOSIS — F1221 Cannabis dependence, in remission: Secondary | ICD-10-CM | POA: Insufficient documentation

## 2024-03-22 DIAGNOSIS — Z5900 Homelessness unspecified: Secondary | ICD-10-CM | POA: Insufficient documentation

## 2024-03-22 DIAGNOSIS — F1121 Opioid dependence, in remission: Secondary | ICD-10-CM | POA: Insufficient documentation

## 2024-03-22 NOTE — Assessment & Plan Note (Signed)
 01/2024 A1c and lipid panel wnl

## 2024-04-13 NOTE — Progress Notes (Deleted)
 Psychiatric Adult Progress Note  Patient Identification: Jim Anderson MRN: [id number] DOB: 1995/10/27   ASSESSMENT / PLAN  Jim Anderson is a 28 y.o. 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 He had recently been hospitalized at [facility] inpatient [facility] from 5/24-6/4 and discharged on olanzapine  5 mg qAM and 15 mg qPM as well as naltrexone 50 mg daily. He did not fill these medications as he could not afford them and felt they were overly sedating.   ***  # Schizophrenia Past medication trials: Zyprexa  - *** Risperdal  1 mg at bedtime  -Labs reviewed from 01/2024- unremarkable A1c and lipid panel   Patient was given contact information for behavioral health clinic and was instructed to call 911 for emergencies.   HISTORY OF PRESENT ILLNESS  Chief Complaint:  No chief complaint on file.  Patient seen ***.  Patient reports feeling *** today. Since the previous visit, ***. Taking meds?*** Patient reports the following adverse effects: ***.   Patient reports *** sleep, ***. Patient reports *** appetite, ***.   Patient rates anxiety a ***/10, depression a ***/10, and anger a ***/10.   Patient denies current SI, HI, and AVH. ***  Stressors include ***.   Substance use: ***   PAST HISTORY   Past Psychiatric History:  Hospitalizations: at least one at [facility] 01/2023 Suicide attempts: denies NSSIB: denies Psychotherapy: denies Dx: substance induced psychosis, schizoaffective disorder Rx: olanzapine , reports names of other antipsychotics are familiar but unable to recall Head trauma: denies Previously saw Dr. Lynnette  Past Medical History: Dx:  has a past medical history of Seizures (HCC).   Head trauma: denies Seizures: noted in 11/2019 that he may have had a singular seizure episode in the context of IV methamphetamine use. He is not on AED.  Allergies: Patient has no known allergies.   Family Psychiatric History:  unknown  Social History:  Homeless Income: unemployed Marital Status: single Children: denies Support: limited Guns/Weapons: denies Legal: formerly incarcerated for larceny  Substance Use History: EtOH:  reports current alcohol use. Nicotine :  reports that he has been smoking. He does not have any smokeless tobacco history on file. THC/CBD: denies current, endorses former use IV drug use: denies current, endorses former use Stimulants: denies current, endorses former use Opiates: denies current, endorses former use Sedative/hypnotics: denies Hallucinogens: denies Seizures: denies Substance Abuse History in the last 12 months:  Yes.      Past Medical History:  Past Medical History:  Diagnosis Date   Seizures (HCC)    No past surgical history on file.   Social History:   Living: *** Occupation: *** Relationship: *** Children: *** Support: *** Legal History: ***  Social History   Socioeconomic History   Marital status: Married    Spouse name: Not on file   Number of children: Not on file   Years of education: Not on file   Highest education level: Not on file  Occupational History   Not on file  Tobacco Use   Smoking status: Every Day   Smokeless tobacco: Not on file  Substance and Sexual Activity   Alcohol use: Yes   Drug use: Yes    Types: Methamphetamines, Marijuana   Sexual activity: Not on file  Other Topics Concern   Not on file  Social  History Narrative   Not on file   Social Drivers of Health   Financial Resource Strain: Not on file  Food Insecurity: Food Insecurity Present (02/18/2024)   Hunger Vital Sign    Worried About Running Out of Food in the Last Year: Often true    Ran Out of Food in the Last Year: Often  true  Transportation Needs: Unmet Transportation Needs (02/18/2024)   PRAPARE - Administrator, Civil Service (Medical): Yes    Lack of Transportation (Non-Medical): Yes  Physical Activity: Not on file  Stress: Not on file  Social Connections: Not on file    Allergies: No Known Allergies  Current Medications: Current Outpatient Medications  Medication Sig Dispense Refill   risperiDONE  (RISPERDAL ) 1 MG tablet Take 1 tablet (1 mg total) by mouth at bedtime. 30 tablet 1   No current facility-administered medications for this visit.        OBJECTIVE  Psychiatric Specialty Exam: General Appearance: appears at stated age, casually dressed and groomed ***  Behavior: pleasant and cooperative ***  Psychomotor Activity: no psychomotor agitation or retardation noted ***  Eye Contact: fair *** Speech: normal amount, volume and fluency ***   Mood: euthymic *** Affect: congruent, pleasant and interactive ***  Thought Process: linear, goal directed, no circumstantial or tangential thought process noted, no racing thoughts or flight of ideas *** Descriptions of Associations: intact ***  Thought Content Hallucinations: denies AH, VH , does not appear responding to stimuli *** Delusions: no paranoia, delusions of control, grandeur, ideas of reference, thought broadcasting, and magical thinking *** Suicidal Thoughts: denies SI, intention, plan *** Homicidal Thoughts: denies HI, intention, plan ***  Alertness/Orientation: alert and fully oriented ***  Insight: fair*** Judgment: fair***  Memory: intact ***  Executive Functions  Concentration: intact *** Attention Span: fair *** Recall: intact *** Fund of Knowledge: fair ***  Physical Exam *** General: Pleasant, well-appearing ***. No acute distress. Pulmonary: Normal effort. No wheezing or rales. Skin: No obvious rash or lesions. Neuro: A&Ox3.No focal deficit.  Review of Systems *** No reported  symptoms   Screenings:  PHQ2-9    Flowsheet Row ED from 07/30/2022 in [facility] Office Visit from 11/18/2020 in [facility]  PHQ-2 Total Score 4 5  PHQ-9 Total Score -- 24   Flowsheet Row ED from 02/18/2024 in [facility] ED from 07/30/2022 in [facility] Office Visit from 11/18/2020 in [facility]  C-SSRS RISK CATEGORY No Risk High Risk Low Risk   Ismael Franco, MD PGY-3 Psychiatry Resident

## 2024-04-18 ENCOUNTER — Encounter (HOSPITAL_COMMUNITY): Payer: MEDICAID | Admitting: Psychiatry
